# Patient Record
Sex: Male | Born: 1960 | Race: Black or African American | Hispanic: No | Marital: Married | Smoking: Never smoker
Health system: Southern US, Community
[De-identification: ages and names within clinical notes are randomized; demographics above are authoritative.]

## PROBLEM LIST (undated history)

## (undated) DIAGNOSIS — G4730 Sleep apnea, unspecified: Secondary | ICD-10-CM

## (undated) DIAGNOSIS — N189 Chronic kidney disease, unspecified: Secondary | ICD-10-CM

## (undated) DIAGNOSIS — I2721 Secondary pulmonary arterial hypertension: Secondary | ICD-10-CM

## (undated) DIAGNOSIS — I509 Heart failure, unspecified: Secondary | ICD-10-CM

## (undated) DIAGNOSIS — D649 Anemia, unspecified: Secondary | ICD-10-CM

## (undated) DIAGNOSIS — I2510 Atherosclerotic heart disease of native coronary artery without angina pectoris: Secondary | ICD-10-CM

## (undated) DIAGNOSIS — I4892 Unspecified atrial flutter: Secondary | ICD-10-CM

## (undated) DIAGNOSIS — I420 Dilated cardiomyopathy: Secondary | ICD-10-CM

## (undated) DIAGNOSIS — R519 Headache, unspecified: Secondary | ICD-10-CM

## (undated) DIAGNOSIS — F4310 Post-traumatic stress disorder, unspecified: Secondary | ICD-10-CM

## (undated) DIAGNOSIS — N186 End stage renal disease: Secondary | ICD-10-CM

## (undated) DIAGNOSIS — I10 Essential (primary) hypertension: Secondary | ICD-10-CM

## (undated) DIAGNOSIS — G4733 Obstructive sleep apnea (adult) (pediatric): Secondary | ICD-10-CM

## (undated) DIAGNOSIS — R001 Bradycardia, unspecified: Secondary | ICD-10-CM

## (undated) DIAGNOSIS — N529 Male erectile dysfunction, unspecified: Secondary | ICD-10-CM

## (undated) DIAGNOSIS — E785 Hyperlipidemia, unspecified: Secondary | ICD-10-CM

## (undated) DIAGNOSIS — R972 Elevated prostate specific antigen [PSA]: Secondary | ICD-10-CM

## (undated) DIAGNOSIS — Z7901 Long term (current) use of anticoagulants: Secondary | ICD-10-CM

## (undated) HISTORY — DX: Heart failure, unspecified: I50.9

## (undated) HISTORY — DX: Post-traumatic stress disorder, unspecified: F43.10

## (undated) HISTORY — DX: Chronic kidney disease, unspecified: N18.9

---

## 2011-03-03 ENCOUNTER — Emergency Department: Payer: Self-pay | Admitting: Emergency Medicine

## 2018-01-21 ENCOUNTER — Other Ambulatory Visit: Payer: Self-pay

## 2018-01-21 ENCOUNTER — Emergency Department
Admission: EM | Admit: 2018-01-21 | Discharge: 2018-01-21 | Disposition: A | Discharge status: Home / Self Care | Payer: Federal, State, Local not specified - PPO | Attending: Student in an Organized Health Care Education/Training Program | Admitting: Student in an Organized Health Care Education/Training Program

## 2018-01-21 ENCOUNTER — Emergency Department: Payer: Federal, State, Local not specified - PPO

## 2018-01-21 DIAGNOSIS — M79671 Pain in right foot: Secondary | ICD-10-CM | POA: Diagnosis present

## 2018-01-21 DIAGNOSIS — I10 Essential (primary) hypertension: Secondary | ICD-10-CM | POA: Diagnosis not present

## 2018-01-21 DIAGNOSIS — M7661 Achilles tendinitis, right leg: Secondary | ICD-10-CM | POA: Diagnosis not present

## 2018-01-21 DIAGNOSIS — R52 Pain, unspecified: Secondary | ICD-10-CM

## 2018-01-21 HISTORY — DX: Essential (primary) hypertension: I10

## 2018-01-21 MED ORDER — HYDROCODONE-ACETAMINOPHEN 5-325 MG PO TABS: 1.0000 | ORAL_TABLET | ORAL | 0 refills | Status: DC | PRN

## 2018-01-21 MED ORDER — NAPROXEN 500 MG PO TABS: 500.0000 mg | ORAL_TABLET | Freq: Two times a day (BID) | ORAL | 0 refills | Status: DC

## 2018-01-21 NOTE — Discharge Instructions (Addendum)
° °  °  IMPRESSION:  1. Posterior calcaneal enthesophyte with adjacent heterotopic  calcification, suggesting Achilles calcific enthesopathy.  2. No acute fracture or other osseous abnormality.

## 2018-01-21 NOTE — ED Triage Notes (Signed)
Pt to the er for pain to the right heel. Pt states it is worse in the morning and gets better as the day goes on. Pain occurs with pressure. Pt is ambulatory but with a limp.

## 2018-01-21 NOTE — ED Provider Notes (Signed)
[facility] Emergency Department Provider Note    First MD Initiated Contact with Patient 01/21/18 2155     (approximate)  I have reviewed the triage vital signs and the nursing notes.   HISTORY  Chief Complaint Foot Pain    HPI Louis Huynh is a 57 y.o. male left heel pain. Denies any ankle trauma.  No fevers.  States pain is worse in the morning and gets better throughout the day.  Worse with walking.  Hasn't tried anything else for pain.   Past Medical History:  Diagnosis Date  . Hypertension    No family history on file. History reviewed. No pertinent surgical history. There are no active problems to display for this patient.     Prior to Admission medications   Medication Sig Start Date End Date Taking? Authorizing Provider  HYDROcodone-acetaminophen (NORCO) 5-325 MG tablet Take 1 tablet by mouth every 4 (four) hours as needed for moderate pain. 01/21/18   Merlyn Lot, MD  naproxen (NAPROSYN) 500 MG tablet Take 1 tablet (500 mg total) by mouth 2 (two) times daily with a meal. 01/21/18 01/21/19  Merlyn Lot, MD    Allergies Patient has no allergy information on record.    Social History Social History   Tobacco Use  . Smoking status: Never Smoker  . Smokeless tobacco: Never Used  Substance Use Topics  . Alcohol use: Yes    Comment: occasionally  . Drug use: Not Currently    Review of Systems Patient denies headaches, rhinorrhea, blurry vision, numbness, shortness of breath, chest pain, edema, cough, abdominal pain, nausea, vomiting, diarrhea, dysuria, fevers, rashes or hallucinations unless otherwise stated above in HPI. ____________________________________________   PHYSICAL EXAM:  VITAL SIGNS: Vitals:   01/21/18 2143 01/21/18 2248  BP: (!) 198/125 132/72  Pulse: (!) 53 78  Resp: 18 18  Temp: 98.6 F (37 C)   SpO2: 97% 98%    Constitutional: Alert and oriented. Well appearing and in no acute  distress. Eyes: Conjunctivae are normal.  Head: Atraumatic. Nose: No congestion/rhinnorhea. Mouth/Throat: Mucous membranes are moist.   Neck: Painless ROM.  Cardiovascular:   Good peripheral circulation. Respiratory: Normal respiratory effort.  No retractions.  Gastrointestinal: Soft and nontender.  Musculoskeletal: No lower extremity tenderness .  No joint effusions.  Pain at the insertion of the Achilles tendon on the calcaneus.  No associated erythema or warmth.  Full painless passive range of motion.  Negative Hoffmann sign.  No edema or swelling.  Does have a slight limp with walking. Neurologic:  Normal speech and language. No gross focal neurologic deficits are appreciated.  Skin:  Skin is warm, dry and intact. No rash noted. Psychiatric: Mood and affect are normal. Speech and behavior are normal.  ____________________________________________   LABS (all labs ordered are listed, but only abnormal results are displayed)  No results found for this or any previous visit (from the past 24 hour(s)). ____________________________________________ ____________________________________________  ZOXWRUEAV  I personally reviewed all radiographic images ordered to evaluate for the above acute complaints and reviewed radiology reports and findings.  These findings were personally discussed with the patient.  Please see medical record for radiology report.  ____________________________________________   PROCEDURES  Procedure(s) performed:  Procedures    Critical Care performed: no ____________________________________________   INITIAL IMPRESSION / ASSESSMENT AND PLAN / ED COURSE  Pertinent labs & imaging results that were available during my care of the patient were reviewed by me and considered in my medical decision making (  see chart for details).  DDX: Tendinitis, bursitis, fracture  Louis Huynh is a 57 y.o. who presents to the ED with symptoms as described above.   Symptoms most consistent with Achilles tendinitis versus bursitis.  No evidence of fracture.  I discussed results of x-ray imaging with patient and need for follow-up with orthopedics or podiatry.  Discussed conservative management.  Patient will be given pain medication as well as anti-inflammatory.  Have discussed with the patient and available family all diagnostics and treatments performed thus far and all questions were answered to the best of my ability. The patient demonstrates understanding and agreement with plan.       ____________________________________________   FINAL CLINICAL IMPRESSION(S) / ED DIAGNOSES  Final diagnoses:  Pain of right heel  Achilles bursitis of right lower extremity      NEW MEDICATIONS STARTED DURING THIS VISIT:  Discharge Medication List as of 01/21/2018 10:39 PM    START taking these medications   Details  HYDROcodone-acetaminophen (NORCO) 5-325 MG tablet Take 1 tablet by mouth every 4 (four) hours as needed for moderate pain., Starting Wed 01/21/2018, Print    naproxen (NAPROSYN) 500 MG tablet Take 1 tablet (500 mg total) by mouth 2 (two) times daily with a meal., Starting Wed 01/21/2018, Until Thu 01/21/2019, Print         Note:  This document was prepared using Dragon voice recognition software and may include unintentional dictation errors.     Merlyn Lot, MD 01/21/18 [phone]

## 2018-08-18 DIAGNOSIS — N183 Chronic kidney disease, stage 3 unspecified: Secondary | ICD-10-CM | POA: Insufficient documentation

## 2018-08-18 DIAGNOSIS — I129 Hypertensive chronic kidney disease with stage 1 through stage 4 chronic kidney disease, or unspecified chronic kidney disease: Secondary | ICD-10-CM | POA: Insufficient documentation

## 2018-08-18 DIAGNOSIS — I10 Essential (primary) hypertension: Secondary | ICD-10-CM | POA: Insufficient documentation

## 2018-10-21 ENCOUNTER — Encounter: Payer: Self-pay | Admitting: Emergency Medicine

## 2018-10-21 ENCOUNTER — Other Ambulatory Visit: Payer: Self-pay

## 2018-10-21 ENCOUNTER — Emergency Department: Payer: Federal, State, Local not specified - PPO

## 2018-10-21 ENCOUNTER — Inpatient Hospital Stay
Admission: EM | Admit: 2018-10-21 | Discharge: 2018-10-25 | DRG: 291 | Disposition: A | Discharge status: Home / Self Care | Payer: Federal, State, Local not specified - PPO | Attending: Internal Medicine | Admitting: Internal Medicine

## 2018-10-21 DIAGNOSIS — I130 Hypertensive heart and chronic kidney disease with heart failure and stage 1 through stage 4 chronic kidney disease, or unspecified chronic kidney disease: Principal | ICD-10-CM | POA: Diagnosis present

## 2018-10-21 DIAGNOSIS — I509 Heart failure, unspecified: Secondary | ICD-10-CM

## 2018-10-21 DIAGNOSIS — I5041 Acute combined systolic (congestive) and diastolic (congestive) heart failure: Secondary | ICD-10-CM | POA: Diagnosis present

## 2018-10-21 DIAGNOSIS — N179 Acute kidney failure, unspecified: Secondary | ICD-10-CM | POA: Diagnosis present

## 2018-10-21 DIAGNOSIS — R0600 Dyspnea, unspecified: Secondary | ICD-10-CM

## 2018-10-21 DIAGNOSIS — E669 Obesity, unspecified: Secondary | ICD-10-CM | POA: Diagnosis present

## 2018-10-21 DIAGNOSIS — Z6834 Body mass index (BMI) 34.0-34.9, adult: Secondary | ICD-10-CM

## 2018-10-21 DIAGNOSIS — R600 Localized edema: Secondary | ICD-10-CM

## 2018-10-21 DIAGNOSIS — Z8249 Family history of ischemic heart disease and other diseases of the circulatory system: Secondary | ICD-10-CM

## 2018-10-21 DIAGNOSIS — E785 Hyperlipidemia, unspecified: Secondary | ICD-10-CM | POA: Diagnosis present

## 2018-10-21 DIAGNOSIS — R0609 Other forms of dyspnea: Secondary | ICD-10-CM | POA: Diagnosis not present

## 2018-10-21 DIAGNOSIS — Z20828 Contact with and (suspected) exposure to other viral communicable diseases: Secondary | ICD-10-CM | POA: Diagnosis present

## 2018-10-21 DIAGNOSIS — R778 Other specified abnormalities of plasma proteins: Secondary | ICD-10-CM

## 2018-10-21 DIAGNOSIS — N189 Chronic kidney disease, unspecified: Secondary | ICD-10-CM | POA: Diagnosis present

## 2018-10-21 DIAGNOSIS — I5021 Acute systolic (congestive) heart failure: Secondary | ICD-10-CM

## 2018-10-21 DIAGNOSIS — I160 Hypertensive urgency: Secondary | ICD-10-CM | POA: Diagnosis present

## 2018-10-21 DIAGNOSIS — Z79899 Other long term (current) drug therapy: Secondary | ICD-10-CM

## 2018-10-21 LAB — BASIC METABOLIC PANEL
Anion gap: 9 (ref 5–15)
BUN: 44 mg/dL — ABNORMAL HIGH (ref 6–20)
CO2: 23 mmol/L (ref 22–32)
Calcium: 8.9 mg/dL (ref 8.9–10.3)
Chloride: 107 mmol/L (ref 98–111)
Creatinine, Ser: 3.15 mg/dL — ABNORMAL HIGH (ref 0.61–1.24)
GFR calc Af Amer: 24 mL/min — ABNORMAL LOW (ref >60)
GFR calc non Af Amer: 21 mL/min — ABNORMAL LOW (ref >60)
Glucose, Bld: 120 mg/dL — ABNORMAL HIGH (ref 70–99)
Potassium: 4.2 mmol/L (ref 3.5–5.1)
Sodium: 139 mmol/L (ref 135–145)

## 2018-10-21 LAB — TROPONIN I (HIGH SENSITIVITY): Troponin I (High Sensitivity): 213 ng/L (ref <18)

## 2018-10-21 LAB — CBC
HCT: 49.5 % (ref 39.0–52.0)
Hemoglobin: 16.6 g/dL (ref 13.0–17.0)
MCH: 30.9 pg (ref 26.0–34.0)
MCHC: 33.5 g/dL (ref 30.0–36.0)
MCV: 92 fL (ref 80.0–100.0)
Platelets: 231 10*3/uL (ref 150–400)
RBC: 5.38 MIL/uL (ref 4.22–5.81)
RDW: 13.5 % (ref 11.5–15.5)
WBC: 8.5 10*3/uL (ref 4.0–10.5)
nRBC: 0 % (ref 0.0–0.2)

## 2018-10-21 LAB — BRAIN NATRIURETIC PEPTIDE: B Natriuretic Peptide: 822 pg/mL — ABNORMAL HIGH (ref 0.0–100.0)

## 2018-10-21 MED ORDER — FUROSEMIDE 10 MG/ML IJ SOLN
40.0000 mg | Freq: Once | INTRAMUSCULAR | Status: AC
  Administered 2018-10-21: 40 mg via INTRAVENOUS
  Filled 2018-10-21: qty 4

## 2018-10-21 MED ORDER — SODIUM CHLORIDE 0.9% FLUSH: 3.0000 mL | Freq: Once | INTRAVENOUS | Status: DC

## 2018-10-21 NOTE — ED Notes (Signed)
+  2 bilateral LE edema

## 2018-10-21 NOTE — ED Notes (Signed)
Dr. Alfred Levins aware of Troponin of 213.

## 2018-10-21 NOTE — ED Triage Notes (Addendum)
Pt to ER with c/o left leg, foot and ankle swelling x 1 week.  Also states gets [facility] when he bends over.  2+ pitting edema noted to ankle, PMS intact.

## 2018-10-21 NOTE — ED Provider Notes (Signed)
[facility] Emergency Department Provider Note   ____________________________________________   First MD Initiated Contact with Patient 10/21/18 2141     (approximate)  I have reviewed the triage vital signs and the nursing notes.   HISTORY  Chief Complaint Leg Swelling    HPI Louis Huynh is a 58 y.o. male with past medical history of hypertension presenting to the ED complaining of leg swelling and shortness of breath.  Patient reports he has had 1 to 2 weeks of gradually worsening swelling in his bilateral lower extremities.  Along with this, he has had difficulty catching his breath with exertion, states he will get out of breath especially when walking up the stairs.  He denies any recent episodes of chest pain and states his breathing is okay when at rest.  He has not had any recent fevers or chills, but has had a cough and a sensation that he cannot clear the phlegm from his throat.  He denies any significant cardiac history.        Past Medical History:  Diagnosis Date  . Hypertension     There are no active problems to display for this patient.   History reviewed. No pertinent surgical history.  Prior to Admission medications   Medication Sig Start Date End Date Taking? Authorizing Provider  HYDROcodone-acetaminophen (NORCO) 5-325 MG tablet Take 1 tablet by mouth every 4 (four) hours as needed for moderate pain. 01/21/18   Merlyn Lot, MD  naproxen (NAPROSYN) 500 MG tablet Take 1 tablet (500 mg total) by mouth 2 (two) times daily with a meal. 01/21/18 01/21/19  Merlyn Lot, MD    Allergies Patient has no allergy information on record.  History reviewed. No pertinent family history.  Social History Social History   Tobacco Use  . Smoking status: Never Smoker  . Smokeless tobacco: Never Used  Substance Use Topics  . Alcohol use: Yes    Comment: occasionally  . Drug use: Not Currently    Review of Systems   Constitutional: No fever/chills Eyes: No visual changes. ENT: No sore throat. Cardiovascular: Denies chest pain. Respiratory: Positive for shortness of breath. Gastrointestinal: No abdominal pain.  No nausea, no vomiting.  No diarrhea.  No constipation. Genitourinary: Negative for dysuria. Musculoskeletal: Negative for back pain.  Positive for lower extremity swelling. Skin: Negative for rash. Neurological: Negative for headaches, focal weakness or numbness.  ____________________________________________   PHYSICAL EXAM:  VITAL SIGNS: ED Triage Vitals  Enc Vitals Group     BP 10/21/18 1747 (!) 174/135     Pulse Rate 10/21/18 1747 (!) 53     Resp 10/21/18 1747 18     Temp 10/21/18 1747 98.6 F (37 C)     Temp src --      SpO2 10/21/18 1747 98 %     Weight 10/21/18 1749 242 lb (109.8 kg)     Height 10/21/18 1749 5\' 8"  (1.727 m)     Head Circumference --      Peak Flow --      Pain Score 10/21/18 1749 0     Pain Loc --      Pain Edu? --      Excl. in [place]? --     Constitutional: Alert and oriented. Eyes: Conjunctivae are normal. Head: Atraumatic. Nose: No congestion/rhinnorhea. Mouth/Throat: Mucous membranes are moist. Neck: Normal ROM Cardiovascular: Normal rate, regular rhythm. Grossly normal heart sounds. Respiratory: Normal respiratory effort.  No retractions.  Rales to bilateral bases. Gastrointestinal:  Soft and nontender. No distention. Genitourinary: deferred Musculoskeletal: No lower extremity tenderness.  2+ pitting edema to bilateral lower extremities up to bilateral knees. Neurologic:  Normal speech and language. No gross focal neurologic deficits are appreciated. Skin:  Skin is warm, dry and intact. No rash noted. Psychiatric: Mood and affect are normal. Speech and behavior are normal.  ____________________________________________   LABS (all labs ordered are listed, but only abnormal results are displayed)  Labs Reviewed  BASIC METABOLIC PANEL -  Abnormal; Notable for the following components:      Result Value   Glucose, Bld 120 (*)    BUN 44 (*)    Creatinine, Ser 3.15 (*)    GFR calc non Af Amer 21 (*)    GFR calc Af Amer 24 (*)    All other components within normal limits  BRAIN NATRIURETIC PEPTIDE - Abnormal; Notable for the following components:   B Natriuretic Peptide 822.0 (*)    All other components within normal limits  TROPONIN I (HIGH SENSITIVITY) - Abnormal; Notable for the following components:   Troponin I (High Sensitivity) 213 (*)    All other components within normal limits  SARS CORONAVIRUS 2 (HOSPITAL ORDER, [place] [facility])  CBC  TROPONIN I (HIGH SENSITIVITY)   ____________________________________________  EKG  ED ECG REPORT I, Blake Divine, the attending physician, personally viewed and interpreted this ECG.   Date: 10/21/2018  EKG Time: 17:44  Rate: 106  Rhythm: normal sinus rhythm  Axis: Normal  Intervals:none  ST&T Change: Inferior and lateral TWI   PROCEDURES  Procedure(s) performed (including Critical Care):  Procedures   ____________________________________________   INITIAL IMPRESSION / ASSESSMENT AND PLAN / ED COURSE       58 year old male presenting with increasing lower extremity swelling over the past 1 to [redacted] weeks along with dyspnea on exertion.  He appears clinically fluid overloaded given lower extremity edema and his chest x-ray is consistent with this, showing cardiomegaly and pulmonary edema.  Given his elevated BNP, this all appears consistent with new onset CHF.  No apparent cardiac ischemia currently, although given his inferior and lateral T wave inversions it is possible that he has had a cardiac event in the recent past.  Will initiate diuresis and admit for further management.  Troponin significantly elevated at 213 but do not suspect ACS given he has asymptomatic currently and has not had chest pain at any point.  This is more likely  related to his new onset CHF as well as AKI it would hold off on starting heparin drip at this time.  He does remain significantly hypertensive, will Place Nitropaste to assist with blood pressure and CHF.  Case discussed with hospitalist, who accepts patient for admission.      ____________________________________________   FINAL CLINICAL IMPRESSION(S) / ED DIAGNOSES  Final diagnoses:  Acute systolic congestive heart failure (HCC)  Bilateral lower extremity edema  Dyspnea on exertion  Elevated troponin     ED Discharge Orders    None       Note:  This document was prepared using Dragon voice recognition software and may include unintentional dictation errors.   Blake Divine, MD 10/22/18 [phone]

## 2018-10-21 NOTE — ED Notes (Signed)
Lab notified to add on BNP 

## 2018-10-22 ENCOUNTER — Other Ambulatory Visit: Payer: Self-pay

## 2018-10-22 ENCOUNTER — Inpatient Hospital Stay: Payer: Federal, State, Local not specified - PPO

## 2018-10-22 ENCOUNTER — Inpatient Hospital Stay (HOSPITAL_COMMUNITY)
Admit: 2018-10-22 | Discharge: 2018-10-22 | Disposition: A | Discharge status: Home / Self Care | Payer: Federal, State, Local not specified - PPO | Attending: Family Medicine | Admitting: Family Medicine

## 2018-10-22 DIAGNOSIS — I340 Nonrheumatic mitral (valve) insufficiency: Secondary | ICD-10-CM

## 2018-10-22 DIAGNOSIS — R0609 Other forms of dyspnea: Secondary | ICD-10-CM | POA: Diagnosis present

## 2018-10-22 DIAGNOSIS — Z20828 Contact with and (suspected) exposure to other viral communicable diseases: Secondary | ICD-10-CM | POA: Diagnosis present

## 2018-10-22 DIAGNOSIS — Z6834 Body mass index (BMI) 34.0-34.9, adult: Secondary | ICD-10-CM | POA: Diagnosis not present

## 2018-10-22 DIAGNOSIS — Z79899 Other long term (current) drug therapy: Secondary | ICD-10-CM | POA: Diagnosis not present

## 2018-10-22 DIAGNOSIS — I509 Heart failure, unspecified: Secondary | ICD-10-CM

## 2018-10-22 DIAGNOSIS — I361 Nonrheumatic tricuspid (valve) insufficiency: Secondary | ICD-10-CM

## 2018-10-22 DIAGNOSIS — E785 Hyperlipidemia, unspecified: Secondary | ICD-10-CM | POA: Diagnosis present

## 2018-10-22 DIAGNOSIS — N179 Acute kidney failure, unspecified: Secondary | ICD-10-CM | POA: Diagnosis present

## 2018-10-22 DIAGNOSIS — I160 Hypertensive urgency: Secondary | ICD-10-CM | POA: Diagnosis present

## 2018-10-22 DIAGNOSIS — E669 Obesity, unspecified: Secondary | ICD-10-CM | POA: Diagnosis present

## 2018-10-22 DIAGNOSIS — N189 Chronic kidney disease, unspecified: Secondary | ICD-10-CM | POA: Diagnosis present

## 2018-10-22 DIAGNOSIS — I5041 Acute combined systolic (congestive) and diastolic (congestive) heart failure: Secondary | ICD-10-CM | POA: Diagnosis present

## 2018-10-22 DIAGNOSIS — I130 Hypertensive heart and chronic kidney disease with heart failure and stage 1 through stage 4 chronic kidney disease, or unspecified chronic kidney disease: Secondary | ICD-10-CM | POA: Diagnosis present

## 2018-10-22 DIAGNOSIS — Z8249 Family history of ischemic heart disease and other diseases of the circulatory system: Secondary | ICD-10-CM | POA: Diagnosis not present

## 2018-10-22 LAB — CBC WITH DIFFERENTIAL/PLATELET
Abs Immature Granulocytes: 0.04 10*3/uL (ref 0.00–0.07)
Basophils Absolute: 0.1 10*3/uL (ref 0.0–0.1)
Basophils Relative: 1 %
Eosinophils Absolute: 0.1 10*3/uL (ref 0.0–0.5)
Eosinophils Relative: 1 %
HCT: 51.6 % (ref 39.0–52.0)
Hemoglobin: 17.4 g/dL — ABNORMAL HIGH (ref 13.0–17.0)
Immature Granulocytes: 1 %
Lymphocytes Relative: 19 %
Lymphs Abs: 1.5 10*3/uL (ref 0.7–4.0)
MCH: 31.1 pg (ref 26.0–34.0)
MCHC: 33.7 g/dL (ref 30.0–36.0)
MCV: 92.1 fL (ref 80.0–100.0)
Monocytes Absolute: 1.1 10*3/uL — ABNORMAL HIGH (ref 0.1–1.0)
Monocytes Relative: 13 %
Neutro Abs: 5.5 10*3/uL (ref 1.7–7.7)
Neutrophils Relative %: 65 %
Platelets: 221 10*3/uL (ref 150–400)
RBC: 5.6 MIL/uL (ref 4.22–5.81)
RDW: 13.6 % (ref 11.5–15.5)
WBC: 8.3 10*3/uL (ref 4.0–10.5)
nRBC: 0 % (ref 0.0–0.2)

## 2018-10-22 LAB — TROPONIN I (HIGH SENSITIVITY): Troponin I (High Sensitivity): 196 ng/L (ref <18)

## 2018-10-22 LAB — SARS CORONAVIRUS 2 BY RT PCR (HOSPITAL ORDER, PERFORMED IN ~~LOC~~ HOSPITAL LAB): SARS Coronavirus 2: NEGATIVE

## 2018-10-22 LAB — ECHOCARDIOGRAM COMPLETE
Height: 68 in
Weight: 3811.2 oz

## 2018-10-22 MED ORDER — SODIUM CHLORIDE 0.9% FLUSH: 3.0000 mL | INTRAVENOUS | Status: DC | PRN

## 2018-10-22 MED ORDER — NITROGLYCERIN 2 % TD OINT
1.0000 [in_us] | TOPICAL_OINTMENT | Freq: Once | TRANSDERMAL | Status: AC
  Administered 2018-10-22: 1 [in_us] via TOPICAL
  Filled 2018-10-22: qty 1

## 2018-10-22 MED ORDER — AMLODIPINE BESYLATE 10 MG PO TABS
10.0000 mg | ORAL_TABLET | Freq: Every day | ORAL | Status: DC
  Administered 2018-10-22 – 2018-10-25 (×4): 10 mg via ORAL
  Filled 2018-10-22 (×4): qty 1

## 2018-10-22 MED ORDER — FUROSEMIDE 10 MG/ML IJ SOLN
40.0000 mg | Freq: Two times a day (BID) | INTRAMUSCULAR | Status: DC
  Administered 2018-10-22 – 2018-10-25 (×7): 40 mg via INTRAVENOUS
  Filled 2018-10-22 (×7): qty 4

## 2018-10-22 MED ORDER — ACETAMINOPHEN 325 MG PO TABS
650.0000 mg | ORAL_TABLET | ORAL | Status: DC | PRN
  Administered 2018-10-22 – 2018-10-24 (×2): 650 mg via ORAL
  Filled 2018-10-22 (×2): qty 2

## 2018-10-22 MED ORDER — ONDANSETRON HCL 4 MG/2ML IJ SOLN: 4.0000 mg | Freq: Four times a day (QID) | INTRAMUSCULAR | Status: DC | PRN

## 2018-10-22 MED ORDER — ENOXAPARIN SODIUM 40 MG/0.4ML ~~LOC~~ SOLN: 40.0000 mg | SUBCUTANEOUS | Status: DC

## 2018-10-22 MED ORDER — SODIUM CHLORIDE 0.9 % IV SOLN: 250.0000 mL | INTRAVENOUS | Status: DC | PRN

## 2018-10-22 MED ORDER — HYDRALAZINE HCL 20 MG/ML IJ SOLN
10.0000 mg | Freq: Four times a day (QID) | INTRAMUSCULAR | Status: DC | PRN
  Administered 2018-10-22 – 2018-10-25 (×3): 10 mg via INTRAVENOUS
  Filled 2018-10-22 (×3): qty 1

## 2018-10-22 MED ORDER — SODIUM CHLORIDE 0.9% FLUSH
3.0000 mL | Freq: Two times a day (BID) | INTRAVENOUS | Status: DC
  Administered 2018-10-22 – 2018-10-25 (×8): 3 mL via INTRAVENOUS

## 2018-10-22 MED ORDER — ISOSORBIDE MONONITRATE ER 30 MG PO TB24
30.0000 mg | ORAL_TABLET | Freq: Every day | ORAL | Status: DC
  Administered 2018-10-22 – 2018-10-25 (×4): 30 mg via ORAL
  Filled 2018-10-22 (×4): qty 1

## 2018-10-22 MED ORDER — AMITRIPTYLINE HCL 25 MG PO TABS
50.0000 mg | ORAL_TABLET | Freq: Every day | ORAL | Status: DC
  Administered 2018-10-22 – 2018-10-24 (×3): 50 mg via ORAL
  Filled 2018-10-22 (×3): qty 2

## 2018-10-22 MED ORDER — HEPARIN SODIUM (PORCINE) 5000 UNIT/ML IJ SOLN
5000.0000 [IU] | Freq: Three times a day (TID) | INTRAMUSCULAR | Status: DC
  Administered 2018-10-22 – 2018-10-25 (×10): 5000 [IU] via SUBCUTANEOUS
  Filled 2018-10-22 (×10): qty 1

## 2018-10-22 MED ORDER — ZOLPIDEM TARTRATE 5 MG PO TABS: 5.0000 mg | ORAL_TABLET | Freq: Every evening | ORAL | Status: DC | PRN

## 2018-10-22 MED ORDER — CARVEDILOL 6.25 MG PO TABS
6.2500 mg | ORAL_TABLET | Freq: Two times a day (BID) | ORAL | Status: DC
  Administered 2018-10-22 – 2018-10-23 (×3): 6.25 mg via ORAL
  Filled 2018-10-22 (×3): qty 1

## 2018-10-22 MED ORDER — NITROGLYCERIN 2 % TD OINT
1.0000 [in_us] | TOPICAL_OINTMENT | Freq: Four times a day (QID) | TRANSDERMAL | Status: DC
  Administered 2018-10-22: 1 [in_us] via TOPICAL
  Filled 2018-10-22 (×3): qty 1

## 2018-10-22 MED ORDER — HYDRALAZINE HCL 25 MG PO TABS
25.0000 mg | ORAL_TABLET | Freq: Three times a day (TID) | ORAL | Status: DC
  Administered 2018-10-22 – 2018-10-24 (×6): 25 mg via ORAL
  Filled 2018-10-22 (×6): qty 1

## 2018-10-22 MED ORDER — ASPIRIN EC 81 MG PO TBEC
81.0000 mg | DELAYED_RELEASE_TABLET | Freq: Every day | ORAL | Status: DC
  Administered 2018-10-22 – 2018-10-25 (×4): 81 mg via ORAL
  Filled 2018-10-22 (×4): qty 1

## 2018-10-22 NOTE — Consult Note (Addendum)
[facility]  CARDIOLOGY CONSULT NOTE  Patient ID: Louis Huynh MRN: [id number] DOB/AGE: 18-May-1960 58 y.o.  Admit date: 10/21/2018 Referring Physician Dr. Bridgett Larsson Primary Physician Lynn Primary Cardiologist None Reason for Consultation New onset CHF  HPI:  Louis Huynh is a 58 y.o. with a past medical history of HTN, HLD, and CKD, followed by the [place], who presented to the ED yesterday given one week of progressive lower extremity edema and dyspnea with exertion. The patient became ill with a URI-like illness last week with dry cough and nasal congestion. Shortly afterwards, he noticed new dyspnea with exertion. In his usual state of health, he is typically able to walk between 1/2 - 1 mile at a time without any difficulty. However, last week he noticed becoming short of breath when walking from the parking lot to his work, which has never happened before. He has also had lower extremity edema, worse in the left leg beginning one week ago. He denies any history of same. He has no know cardiac history.   ED work up showed elevated BNP to 822. Creatinine elevated to 3.15 - unknown baseline as [facility]. Troponin was elevated initially to 213, then 196 on repeat. CXR suggestive of pulmonary edema.   Review of systems complete and found to be negative unless listed above   Past Medical History:  Diagnosis Date  . Hypertension     History reviewed. No pertinent surgical history.  Medications Prior to Admission  Medication Sig Dispense Refill Last Dose  . HYDROcodone-acetaminophen (NORCO) 5-325 MG tablet Take 1 tablet by mouth every 4 (four) hours as needed for moderate pain. 6 tablet 0   . naproxen (NAPROSYN) 500 MG tablet Take 1 tablet (500 mg total) by mouth 2 (two) times daily with a meal. 20 tablet 0    Social History   Socioeconomic History  . Marital status: Married    Spouse name: Not on file  . Number of children: Not on file  . Years of  education: Not on file  . Highest education level: Not on file  Occupational History  . Not on file  Social Needs  . Financial resource strain: Not on file  . Food insecurity    Worry: Not on file    Inability: Not on file  . Transportation needs    Medical: Not on file    Non-medical: Not on file  Tobacco Use  . Smoking status: Never Smoker  . Smokeless tobacco: Never Used  Substance and Sexual Activity  . Alcohol use: Yes    Comment: occasionally  . Drug use: Not Currently  . Sexual activity: Yes  Lifestyle  . Physical activity    Days per week: Not on file    Minutes per session: Not on file  . Stress: Not on file  Relationships  . Social Herbalist on phone: Not on file    Gets together: Not on file    Attends religious service: Not on file    Active member of club or organization: Not on file    Attends meetings of clubs or organizations: Not on file    Relationship status: Not on file  . Intimate partner violence    Fear of current or ex partner: Not on file    Emotionally abused: Not on file    Physically abused: Not on file    Forced sexual activity: Not on file  Other Topics Concern  .  Not on file  Social History Narrative  . Not on file    History reviewed. No pertinent family history.   Review of systems complete and found to be negative unless listed above    PHYSICAL EXAM  General: Well developed, well nourished, in no acute distress HEENT:  Normocephalic and atramatic Neck:  No JVD.  Lungs: Clear bilaterally to auscultation and percussion. Faint crackles to bases bilaterally.  Heart: Regular rhythm, tachycardic rate. S3 sound heard. No appreciable murmur. Extremities: 2+ pitting edema to bilateral lower extremities, around ankles, L>R Neuro: Alert and oriented X 3. Psych:  Good affect, responds appropriately  Labs:   Lab Results  Component Value Date   WBC 8.3 10/22/2018   HGB 17.4 (H) 10/22/2018   HCT 51.6 10/22/2018   MCV  92.1 10/22/2018   PLT 221 10/22/2018    Recent Labs  Lab 10/21/18 1752  NA 139  K 4.2  CL 107  CO2 23  BUN 44*  CREATININE 3.15*  CALCIUM 8.9  GLUCOSE 120*   No results found for: CKTOTAL, CKMB, CKMBINDEX, TROPONINI No results found for: CHOL No results found for: HDL No results found for: LDLCALC No results found for: TRIG No results found for: CHOLHDL No results found for: LDLDIRECT    Radiology: Dg Chest 2 View  Result Date: 10/21/2018 CLINICAL DATA:  Shortness of breath, lower extremity swelling for 1 week EXAM: CHEST - 2 VIEW COMPARISON:  None. FINDINGS: Hazy interstitial opacities with cephalized indistinct pulmonary vascularity and cardiomegaly. No consolidation. No pneumothorax or effusion. Remaining cardiomediastinal contours are unremarkable. No acute osseous or soft tissue abnormality. IMPRESSION: Findings compatible with CHF/volume overload including cardiomegaly and pulmonary edema Electronically Signed   By: Lovena Le M.D.   On: 10/21/2018 18:11    EKG:  Sinus rhythm, rate of 106 BPM, normal axis, left atrial enlargement, T-wave V5-V6, II, III, aVF.   ASSESSMENT AND PLAN:  Louis Huynh is a 58 year old male with a history of HTN, HLD, and CKD, seen at the [place] for his care, who presented to the ED early this morning with lower extremity swelling and dyspnea with exertion. CXR with pulmonary edema. BNP elevated to 822. Findings suggestive of new onset heart failure from unknown etiology, although uncontrolled hypertension may be contributing. His troponin was initially elevated to 213, then 196 on repeat. Elevated troponin thought to be secondary to demand ischemia versus hypoxia. Lower suspicion for ACS at this time given no chest pain. Will continue to closely monitor and consider ischemic work up in the outpatient setting.   1. Follow up ECHO results still pending 2. Continue with BP management to goal BP <130/80 3. Elevated troponin likely secondary to demand  ischemia versus hypoxia - no further cardiac interventions or diagnostics indicated at this time 4. Switch lasix to oral when able - continue at 40 mg daily in the outpatient setting  5. Close outpatient follow up with either me or Dr. Nehemiah Massed in 1-2 week following discharge   The patient's history and exam findings were discussed with Dr. Nehemiah Massed. The plan was made in conjunction with Dr. Nehemiah Massed.  Signed: Hilbert Odor PA-C 10/22/2018, 9:51 AM  The patient has been interviewed and examined. I agree with assessment and plan above. Serafina Royals MD [facility]

## 2018-10-22 NOTE — Progress Notes (Signed)
Ch called pt and spoke with him regarding [place]. Pt shared that he would like all life-prolonging measures at this time. Pt expressed his concerns about his recent diagnosis of CHF. Ch allowed space for pt to lament about his health changes. Pt shared that his brothers both had CHF as well. Ch provided words of encouragement and informed pt that she would provide an AD to him within the hour.   Ch visited pt to complete AD education. Pt was alert and oriented upon ch arrival. 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 Ch offered prayer for healing for pt with wife present. Visit was appreciated.    10/22/18 1200  Clinical Encounter Type  Visited With Patient and family together  Visit Type Psychological support;Spiritual support;Social support;Other (Comment) (AD education )  Referral From Physician  Consult/Referral To Chaplain  Spiritual Encounters  Spiritual Needs Emotional;Grief support;Prayer  Stress Factors  Patient Stress Factors Health changes;Major life changes  Family Stress Factors None identified  Advance Directives (For Healthcare)  Does Patient Have a Medical Advance Directive? No  Would patient like information on creating a medical advance directive? Yes (Inpatient - patient defers creating a medical advance directive at this time - Information given)

## 2018-10-22 NOTE — Progress Notes (Signed)
To ultrasound via bed 

## 2018-10-22 NOTE — Progress Notes (Signed)
*  PRELIMINARY RESULTS* Echocardiogram 2D Echocardiogram has been performed.  Sherrie Sport 10/22/2018, 10:52 AM

## 2018-10-22 NOTE — ED Notes (Signed)
ED TO INPATIENT HANDOFF REPORT  ED Nurse Name and Phone #: Tana Conch Halma Name/Age/Gender Louis Huynh 58 y.o. male Room/Bed: ED25A/ED25A  Code Status   Code Status: Full Code  Home/SNF/Other Home Patient oriented to: self, place, time and situation Is this baseline? Yes   Triage Complete: Triage complete  Chief Complaint leg swelling, SOB  Triage Note Pt to ER with c/o left leg, foot and ankle swelling x 1 week.  Also states gets [facility] when he bends over.  2+ pitting edema noted to ankle, PMS intact.   Allergies Not on File  Level of Care/Admitting Diagnosis ED Disposition    ED Disposition Condition [facility] Area: [place] [[id number]]  Level of Care: Telemetry [5]  Covid Evaluation: Asymptomatic Screening Protocol (No Symptoms)  Diagnosis: Acute CHF (congestive heart failure) [facility]) [id number]  Admitting Physician: Christel Mormon [id number]  Attending Physician: Christel Mormon [id number]  Estimated length of stay: past midnight tomorrow  Certification:: I certify this patient will need inpatient services for at least 2 midnights  PT Class (Do Not Modify): Inpatient [101]  PT Acc Code (Do Not Modify): Private [1]       B Medical/Surgery History Past Medical History:  Diagnosis Date  . Hypertension    History reviewed. No pertinent surgical history.   A IV Location/Drains/Wounds Patient Lines/Drains/Airways Status   Active Line/Drains/Airways    Name:   Placement date:   Placement time:   Site:   Days:   Peripheral IV 10/21/18 Right Forearm   10/21/18    2211    Forearm   1          Intake/Output Last 24 hours  Intake/Output Summary (Last 24 hours) at 10/22/2018 0301 Last data filed at 10/22/2018 0300 Gross per 24 hour  Intake -  Output 1550 ml  Net -1550 ml    Labs/Imaging Results for orders placed or performed during the hospital encounter of 10/21/18 (from the past 48 hour(s))  Basic metabolic panel      Status: Abnormal   Collection Time: 10/21/18  5:52 PM  Result Value Ref Range   Sodium 139 135 - 145 mmol/L   Potassium 4.2 3.5 - 5.1 mmol/L   Chloride 107 98 - 111 mmol/L   CO2 23 22 - 32 mmol/L   Glucose, Bld 120 (H) 70 - 99 mg/dL   BUN 44 (H) 6 - 20 mg/dL   Creatinine, Ser 3.15 (H) 0.61 - 1.24 mg/dL   Calcium 8.9 8.9 - 10.3 mg/dL   GFR calc non Af Amer 21 (L) >60 mL/min   GFR calc Af Amer 24 (L) >60 mL/min   Anion gap 9 5 - 15    Comment: Performed at [facility], [address] [place], [place] [address]  CBC     Status: None   Collection Time: 10/21/18  5:52 PM  Result Value Ref Range   WBC 8.5 4.0 - 10.5 K/uL   RBC 5.38 4.22 - 5.81 MIL/uL   Hemoglobin 16.6 13.0 - 17.0 g/dL   HCT 49.5 39.0 - 52.0 %   MCV 92.0 80.0 - 100.0 fL   MCH 30.9 26.0 - 34.0 pg   MCHC 33.5 30.0 - 36.0 g/dL   RDW 13.5 11.5 - 15.5 %   Platelets 231 150 - 400 K/uL   nRBC 0.0 0.0 - 0.2 %    Comment: Performed at [facility], [address] [place],  [place] [address]  Brain natriuretic peptide     Status: Abnormal   Collection Time: 10/21/18  5:52 PM  Result Value Ref Range   B Natriuretic Peptide 822.0 (H) 0.0 - 100.0 pg/mL    Comment: Performed at [facility], [place], [place] [address]  Troponin I (High Sensitivity)     Status: Abnormal   Collection Time: 10/21/18 11:03 PM  Result Value Ref Range   Troponin I (High Sensitivity) 213 (HH) <18 ng/L    Comment: CRITICAL RESULT CALLED TO, READ BACK BY AND VERIFIED WITH SONJA Loyde Orth ON 10/21/18 AT 2354 [facility] (NOTE) Elevated high sensitivity troponin I (hsTnI) values and significant  changes across serial measurements may suggest ACS but many other  chronic and acute conditions are known to elevate hsTnI results.  Refer to the "Links" section for chest pain algorithms and additional  guidance. Performed at [facility], [address] [place], [place] [address]   SARS Coronavirus 2  [facility] order, Performed in [facility] hospital lab) Nasopharyngeal Nasopharyngeal Swab     Status: None   Collection Time: 10/22/18 12:48 AM   Specimen: Nasopharyngeal Swab  Result Value Ref Range   SARS Coronavirus 2 NEGATIVE NEGATIVE    Comment: (NOTE) If result is NEGATIVE SARS-CoV-2 target nucleic acids are NOT DETECTED. The SARS-CoV-2 RNA is generally detectable in upper and lower  respiratory specimens during the acute phase of infection. The lowest  concentration of SARS-CoV-2 viral copies this assay can detect is 250  copies / mL. A negative result does not preclude SARS-CoV-2 infection  and should not be used as the sole basis for treatment or other  patient management decisions.  A negative result may occur with  improper specimen collection / handling, submission of specimen other  than nasopharyngeal swab, presence of viral mutation(s) within the  areas targeted by this assay, and inadequate number of viral copies  (<250 copies / mL). A negative result must be combined with clinical  observations, patient history, and epidemiological information. If result is POSITIVE SARS-CoV-2 target nucleic acids are DETECTED. The SARS-CoV-2 RNA is generally detectable in upper and lower  respiratory specimens dur ing the acute phase of infection.  Positive  results are indicative of active infection with SARS-CoV-2.  Clinical  correlation with patient history and other diagnostic information is  necessary to determine patient infection status.  Positive results do  not rule out bacterial infection or co-infection with other viruses. If result is PRESUMPTIVE POSTIVE SARS-CoV-2 nucleic acids MAY BE PRESENT.   A presumptive positive result was obtained on the submitted specimen  and confirmed on repeat testing.  While 2019 novel coronavirus  (SARS-CoV-2) nucleic acids may be present in the submitted sample  additional confirmatory testing may be necessary for epidemiological  and /  or clinical management purposes  to differentiate between  SARS-CoV-2 and other Sarbecovirus currently known to infect humans.  If clinically indicated additional testing with an alternate test  methodology [phone]) is advised. The SARS-CoV-2 RNA is generally  detectable in upper and lower respiratory sp ecimens during the acute  phase of infection. The expected result is Negative. Fact Sheet for Patients:  StrictlyIdeas.no Fact Sheet for Healthcare Providers: BankingDealers.co.za This test is not yet approved or cleared by the [place] FDA and has been authorized for detection and/or diagnosis of SARS-CoV-2 by FDA under an Emergency Use Authorization (EUA).  This EUA will remain in effect (meaning this test can be used) for the duration  of the COVID-19 declaration under Section 564(b)(1) of the Act, 21 U.S.C. section 360bbb-3(b)(1), unless the authorization is terminated or revoked sooner. Performed at [facility], [address] [place], [place] [address]    Dg Chest 2 View  Result Date: 10/21/2018 CLINICAL DATA:  Shortness of breath, lower extremity swelling for 1 week EXAM: CHEST - 2 VIEW COMPARISON:  None. FINDINGS: Hazy interstitial opacities with cephalized indistinct pulmonary vascularity and cardiomegaly. No consolidation. No pneumothorax or effusion. Remaining cardiomediastinal contours are unremarkable. No acute osseous or soft tissue abnormality. IMPRESSION: Findings compatible with CHF/volume overload including cardiomegaly and pulmonary edema Electronically Signed   By: Lovena Le M.D.   On: 10/21/2018 18:11    Pending Labs Unresulted Labs (From admission, onward)    Start     Ordered   10/23/18 [id number]  Basic metabolic panel  Daily,   STAT     10/22/18 0057   10/22/18 0500  CBC WITH DIFFERENTIAL  Tomorrow morning,   STAT     10/22/18 0057   10/22/18 0058  HIV antibody (Routine Testing)  Once,   STAT      10/22/18 0057          Vitals/Pain Today's Vitals   10/22/18 0100 10/22/18 0130 10/22/18 0200 10/22/18 0230  BP: (!) 166/123 (!) 175/127 (!) 158/130 (!) 159/114  Pulse: (!) 51 97 (!) 102 100  Resp:      Temp:      SpO2: 93% 91% (!) 89% 90%  Weight:      Height:      PainSc:    0-No pain    Isolation Precautions No active isolations  Medications Medications  sodium chloride flush (NS) 0.9 % injection 3 mL (3 mLs Intravenous Not Given 10/21/18 2144)  sodium chloride flush (NS) 0.9 % injection 3 mL (has no administration in time range)  sodium chloride flush (NS) 0.9 % injection 3 mL (has no administration in time range)  0.9 %  sodium chloride infusion (has no administration in time range)  acetaminophen (TYLENOL) tablet 650 mg (has no administration in time range)  ondansetron (ZOFRAN) injection 4 mg (has no administration in time range)  furosemide (LASIX) injection 40 mg (has no administration in time range)  aspirin EC tablet 81 mg (has no administration in time range)  zolpidem (AMBIEN) tablet 5 mg (has no administration in time range)  hydrALAZINE (APRESOLINE) injection 10 mg (has no administration in time range)  nitroGLYCERIN (NITROGLYN) 2 % ointment 1 inch (1 inch Topical Not Given 10/22/18 0255)  heparin injection 5,000 Units (has no administration in time range)  furosemide (LASIX) injection 40 mg (40 mg Intravenous Given 10/21/18 2309)  nitroGLYCERIN (NITROGLYN) 2 % ointment 1 inch (1 inch Topical Given 10/22/18 0027)    Mobility walks Low fall risk   Focused Assessments Cardiac Assessment Handoff:    No results found for: CKTOTAL, CKMB, CKMBINDEX, TROPONINI No results found for: DDIMER Does the Patient currently have chest pain? No      R Recommendations: See Admitting Provider Note  Report given to:   Additional Notes:

## 2018-10-22 NOTE — Progress Notes (Signed)
The patient feels better shortness of breath.  Still has some leg edema. Vital sign and lab reviewed.  Physical examination done. Continue current treatment. Discussed with the patient and RN.  Time spent about 20 minutes.

## 2018-10-22 NOTE — H&P (Addendum)
Louis Huynh at [place] NAME: Louis Huynh    MR#:  [id number]  DATE OF BIRTH:  1960-04-04  DATE OF ADMISSION:  10/21/2018  PRIMARY CARE PHYSICIAN: System, Pcp Not In   REQUESTING/REFERRING PHYSICIAN: Blake Divine, MD CHIEF COMPLAINT:   Chief Complaint  Patient presents with  . Leg Swelling    HISTORY OF PRESENT ILLNESS:  Louis Huynh  is a 58 y.o. African-American male with a known history of hypertension, presented to the emergency room with acute onset of dyspnea for the last week with associated worsening lower extremity edema and orthopnea as well as dyspnea on exertion and paroxysmal nocturnal dyspnea.  He admitted to dry cough with occasional expectoration of clear sputum as well as occasional wheezing.  He denied any fever or chills.  No recent sick exposures.  No dysuria, oliguria or hematuria or flank pain.  Upon presentation to the emergency room, blood pressure was 174/135 with a pulse of 53 and otherwise normal vital signs.  Labs were remarkable for hemoconcentration and elevated BUN and creatinine 44/3.15 with no previous levels for comparison.  Troponin I was 213 and later 196.  COVID-19 test came back negative.  Chest x-ray showed cardiomegaly and interstitial pulmonary infiltrates concerning for pulmonary edema and acute CHF.  EKG showed sinus tachycardia with a rate of 106 with T wave inversion laterally.  The patient was given Lasix 40 mg IV as well as an inch Nitropaste.  He will be admitted to telemetry bed for further evaluation and management PAST MEDICAL HISTORY:   Past Medical History:  Diagnosis Date  . Hypertension     PAST SURGICAL HISTORY:  History reviewed. No pertinent surgical history.  He denies any previous surgeries.  SOCIAL HISTORY:   Social History   Tobacco Use  . Smoking status: Never Smoker  . Smokeless tobacco: Never Used  Substance Use Topics  . Alcohol use: Yes    Comment: occasionally     FAMILY HISTORY:  History reviewed. No pertinent family history.  Positive for coronary artery disease.  He has 2 brothers who had cardiac transplants. DRUG ALLERGIES:  Not on File.  No reported allergies.  REVIEW OF SYSTEMS:   ROS As per history of present illness. All pertinent systems were reviewed above. Constitutional,  HEENT, cardiovascular, respiratory, GI, GU, musculoskeletal, neuro, psychiatric, endocrine,  integumentary and hematologic systems were reviewed and are otherwise  negative/unremarkable except for positive findings mentioned above in the HPI.   MEDICATIONS AT HOME:   Prior to Admission medications   Medication Sig Start Date End Date Taking? Authorizing Provider  HYDROcodone-acetaminophen (NORCO) 5-325 MG tablet Take 1 tablet by mouth every 4 (four) hours as needed for moderate pain. 01/21/18   Merlyn Lot, MD  naproxen (NAPROSYN) 500 MG tablet Take 1 tablet (500 mg total) by mouth 2 (two) times daily with a meal. 01/21/18 01/21/19  Merlyn Lot, MD      VITAL SIGNS:  Blood pressure (!) 162/120, pulse (!) 50, temperature 98.1 F (36.7 C), temperature source Oral, resp. rate 20, height 5\' 8"  (1.727 m), weight 108 kg, SpO2 95 %.  PHYSICAL EXAMINATION:  Physical Exam  GENERAL:  58 y.o.-year-old African-American male patient lying in the bed with no acute distress.  EYES: Pupils equal, round, reactive to light and accommodation. No scleral icterus. Extraocular muscles intact.  HEENT: Head atraumatic, normocephalic. Oropharynx and nasopharynx clear.  NECK:  Supple, no jugular venous distention. No thyroid enlargement, no tenderness.  LUNGS: Diminished bibasilar breath sounds with bibasal rales CARDIOVASCULAR: Regular rate and rhythm, S1, S2 normal. No murmurs, rubs, or gallops.  ABDOMEN: Soft, nondistended, nontender. Bowel sounds present. No organomegaly or mass.  EXTREMITIES: 2+ bilateral lower extremity pitting l edema, with no cyanosis, or  clubbing.  NEUROLOGIC: Cranial nerves II through XII are intact. Muscle strength 5/5 in all extremities. Sensation intact. Gait not checked.  PSYCHIATRIC: The patient is alert and oriented x 3.  Normal affect and good eye contact. SKIN: No obvious rash, lesion, or ulcer.   LABORATORY PANEL:   CBC Recent Labs  Lab 10/22/18 0410  WBC 8.3  HGB 17.4*  HCT 51.6  PLT 221   ------------------------------------------------------------------------------------------------------------------  Chemistries  Recent Labs  Lab 10/21/18 1752  NA 139  K 4.2  CL 107  CO2 23  GLUCOSE 120*  BUN 44*  CREATININE 3.15*  CALCIUM 8.9   ------------------------------------------------------------------------------------------------------------------  Cardiac Enzymes No results for input(s): TROPONINI in the last 168 hours. ------------------------------------------------------------------------------------------------------------------  RADIOLOGY:  Dg Chest 2 View  Result Date: 10/21/2018 CLINICAL DATA:  Shortness of breath, lower extremity swelling for 1 week EXAM: CHEST - 2 VIEW COMPARISON:  None. FINDINGS: Hazy interstitial opacities with cephalized indistinct pulmonary vascularity and cardiomegaly. No consolidation. No pneumothorax or effusion. Remaining cardiomediastinal contours are unremarkable. No acute osseous or soft tissue abnormality. IMPRESSION: Findings compatible with CHF/volume overload including cardiomegaly and pulmonary edema Electronically Signed   By: Lovena Le M.D.   On: 10/21/2018 18:11      IMPRESSION AND PLAN:   1.  New onset acute CHF, likely diastolic.  The patient will be admitted to to a telemetry bed.  He will be diuresed with IV Lasix.  Will follow serial troponin and obtain a 2D echo and a cardiology consultation this a.m. by [facility].  I notified Dr. Nehemiah Massed.  2.  Hypertensive urgency.  This is likely the culprit for #1.  The patient will be placed on  PRN IV hydralazine.  We will continue him on Nitropaste.  3.  Acute kidney injury.  This could be prerenal from acute CHF.  Will follow BMP with diuresis.  Will obtain bilateral renal ultrasound.  4.  DVT prophylaxis.  Subcutaneous heparin.   All the records are reviewed and case discussed with ED provider. The plan of care was discussed in details with the patient (and family). I answered all questions. The patient agreed to proceed with the above mentioned plan. Further management will depend upon hospital course.   CODE STATUS: Full code  TOTAL TIME TAKING CARE OF THIS PATIENT: 50 minutes.    Christel Mormon M.D on 10/22/2018 at 5:04 AM  Pager - [phone]  After 6pm go to [email or web address] - Proofreader  [facility]  Office  [phone]  CC: Primary care physician; System, Pcp Not In   Note: This dictation was prepared with Dragon dictation along with smaller phrase technology. Any transcriptional errors that result from this process are unintentional.

## 2018-10-22 NOTE — Plan of Care (Signed)
  Problem: Education: Goal: Knowledge of General Education information will improve Description: Including pain rating scale, medication(s)/side effects and non-pharmacologic comfort measures Outcome: Progressing   Problem: Health Behavior/Discharge Planning: Goal: Ability to manage health-related needs will improve Outcome: Progressing   Problem: Clinical Measurements: Goal: Ability to maintain clinical measurements within normal limits will improve Outcome: Progressing Goal: Will remain free from infection Outcome: Progressing Note: Remains afebrile Goal: Diagnostic test results will improve Outcome: Progressing Note: BNP 822, troponin 213, 196 Goal: Cardiovascular complication will be avoided Outcome: Progressing Note: No chest pain at this time   Problem: Education: Goal: Individualized Educational Video(s) Outcome: Progressing Note: Pt watched all 4 HF videos, questions answered

## 2018-10-22 NOTE — Plan of Care (Signed)
  Problem: Education: Goal: Knowledge of General Education information will improve Description: Including pain rating scale, medication(s)/side effects and non-pharmacologic comfort measures Outcome: Progressing Note: Patient admitted at 0400. No complaints of pain. Patient profile completed. Blood pressure elevated, PRN hydralazine administered. Heart failure education initiated.   Problem: Clinical Measurements: Goal: Respiratory complications will improve Outcome: Progressing   Problem: Education: Goal: Ability to demonstrate management of disease process will improve Outcome: Progressing Note: Provide education when patient's wife is available.

## 2018-10-22 NOTE — ED Notes (Signed)
AC called for admission bed.

## 2018-10-22 NOTE — ED Notes (Signed)
Patient placed on 2L O2 via [place] for sats at 89-90%.

## 2018-10-22 NOTE — ED Notes (Signed)
Patient's wife at bedside.

## 2018-10-22 NOTE — Plan of Care (Signed)
  Problem: Education: Goal: Knowledge of General Education information will improve Description: Including pain rating scale, medication(s)/side effects and non-pharmacologic comfort measures Outcome: Progressing   Problem: Health Behavior/Discharge Planning: Goal: Ability to manage health-related needs will improve Outcome: Progressing   Problem: Clinical Measurements: Goal: Ability to maintain clinical measurements within normal limits will improve Outcome: Progressing Goal: Will remain free from infection Outcome: Progressing Note: Remains afebrile Goal: Diagnostic test results will improve Outcome: Progressing Note: BNP 822, troponin 213, 196 Goal: Cardiovascular complication will be avoided Outcome: Progressing Note: No chest pain at this time

## 2018-10-22 NOTE — Plan of Care (Signed)
Nutrition Education Note  RD consulted for nutrition education regarding new onset CHF.  58 y.o. African-American male with a known history of hypertension admitted with new CHF  RD provided "Low Sodium Nutrition Therapy" handout from the Academy of [employer] and Dietetics. Reviewed patient's dietary recall. Provided examples on ways to decrease sodium intake in diet. Discouraged intake of processed foods and use of salt shaker. Encouraged fresh fruits and vegetables as well as whole grain sources of carbohydrates to maximize fiber intake.   RD discussed why it is important for patient to adhere to diet recommendations, and emphasized the role of fluids, foods to avoid, and importance of weighing self daily. Teach back method used.  Expect good compliance.  Body mass index is 36.22 kg/m. Pt meets criteria for obesity based on current BMI.  Current diet order is 2 gram sodium diet, patient is consuming approximately 50-100% of meals at this time. Labs and medications reviewed. No further nutrition interventions warranted at this time. RD contact information provided. If additional nutrition issues arise, please re-consult RD.   Koleen Distance MS, RD, LDN Pager #- [phone] Office#- [phone] After Hours Pager: [phone]

## 2018-10-22 NOTE — Progress Notes (Signed)
Back from ultrasound

## 2018-10-23 LAB — BASIC METABOLIC PANEL
Anion gap: 9 (ref 5–15)
BUN: 39 mg/dL — ABNORMAL HIGH (ref 6–20)
CO2: 26 mmol/L (ref 22–32)
Calcium: 8.5 mg/dL — ABNORMAL LOW (ref 8.9–10.3)
Chloride: 104 mmol/L (ref 98–111)
Creatinine, Ser: 2.78 mg/dL — ABNORMAL HIGH (ref 0.61–1.24)
GFR calc Af Amer: 28 mL/min — ABNORMAL LOW (ref >60)
GFR calc non Af Amer: 24 mL/min — ABNORMAL LOW (ref >60)
Glucose, Bld: 103 mg/dL — ABNORMAL HIGH (ref 70–99)
Potassium: 3.9 mmol/L (ref 3.5–5.1)
Sodium: 139 mmol/L (ref 135–145)

## 2018-10-23 LAB — CBC
HCT: 45.7 % (ref 39.0–52.0)
Hemoglobin: 15.4 g/dL (ref 13.0–17.0)
MCH: 30.8 pg (ref 26.0–34.0)
MCHC: 33.7 g/dL (ref 30.0–36.0)
MCV: 91.4 fL (ref 80.0–100.0)
Platelets: 199 10*3/uL (ref 150–400)
RBC: 5 MIL/uL (ref 4.22–5.81)
RDW: 13.5 % (ref 11.5–15.5)
WBC: 9.4 10*3/uL (ref 4.0–10.5)
nRBC: 0 % (ref 0.0–0.2)

## 2018-10-23 LAB — HIV ANTIBODY (ROUTINE TESTING W REFLEX): HIV Screen 4th Generation wRfx: NONREACTIVE

## 2018-10-23 NOTE — Plan of Care (Signed)
Patient diuresing well with IV lasix. Ambulated around nurses station x3 with standby assistance. DOE however O2 sats remained above 90%. HR up to 120 with exertion but returns WNL at rest.   Problem: Clinical Measurements: Goal: Ability to maintain clinical measurements within normal limits will improve Outcome: Progressing Goal: Respiratory complications will improve Outcome: Progressing   Problem: Education: Goal: Ability to demonstrate management of disease process will improve Outcome: Progressing   Problem: Activity: Goal: Capacity to carry out activities will improve Outcome: Progressing   Problem: Cardiac: Goal: Ability to achieve and maintain adequate cardiopulmonary perfusion will improve Outcome: Progressing

## 2018-10-23 NOTE — Progress Notes (Signed)
[facility] [place] NAME: Louis Huynh    MR#:  [id number]  DATE OF BIRTH:  Dec 15, 1960  SUBJECTIVE:  CHIEF COMPLAINT:   Chief Complaint  Patient presents with  . Leg Swelling   The patient has better shortness of breath, still leg edema. REVIEW OF SYSTEMS:  Review of Systems  Constitutional: Negative for chills, fever and malaise/fatigue.  HENT: Negative for sore throat.   Eyes: Negative for blurred vision and double vision.  Respiratory: Negative for cough, hemoptysis, sputum production, shortness of breath, wheezing and stridor.   Cardiovascular: Positive for leg swelling. Negative for chest pain, palpitations and orthopnea.  Gastrointestinal: Negative for abdominal pain, blood in stool, diarrhea, melena, nausea and vomiting.  Genitourinary: Negative for dysuria, flank pain and hematuria.  Musculoskeletal: Negative for back pain and joint pain.  Skin: Negative for rash.  Neurological: Negative for dizziness, sensory change, focal weakness, seizures, loss of consciousness, weakness and headaches.  Endo/Heme/Allergies: Negative for polydipsia.  Psychiatric/Behavioral: Negative for depression. The patient is not nervous/anxious.     DRUG ALLERGIES:  Not on File VITALS:  Blood pressure (!) 149/112, pulse (!) 51, temperature 98.5 F (36.9 C), temperature source Oral, resp. rate 20, height 5\' 8"  (1.727 m), weight 105 kg, SpO2 96 %. PHYSICAL EXAMINATION:  Physical Exam Constitutional:      General: He is not in acute distress.    Appearance: Normal appearance. He is obese.  HENT:     Head: Normocephalic.     Mouth/Throat:     Mouth: Mucous membranes are moist.  Eyes:     General: No scleral icterus.    Conjunctiva/sclera: Conjunctivae normal.     Pupils: Pupils are equal, round, and reactive to light.  Neck:     Musculoskeletal: Normal range of motion and neck supple.     Vascular: No JVD.     Trachea: No tracheal deviation.   Cardiovascular:     Rate and Rhythm: Normal rate and regular rhythm.     Heart sounds: Normal heart sounds. No murmur. No gallop.   Pulmonary:     Effort: Pulmonary effort is normal. No respiratory distress.     Breath sounds: Rales present. No wheezing.  Abdominal:     General: Bowel sounds are normal. There is no distension.     Palpations: Abdomen is soft.     Tenderness: There is no abdominal tenderness. There is no rebound.  Musculoskeletal: Normal range of motion.        General: No tenderness.     Right lower leg: Edema present.     Left lower leg: Edema present.  Skin:    Findings: No erythema or rash.  Neurological:     General: No focal deficit present.     Mental Status: He is alert and oriented to person, place, and time.     Cranial Nerves: No cranial nerve deficit.  Psychiatric:        Mood and Affect: Mood normal.    LABORATORY PANEL:  Male CBC Recent Labs  Lab 10/23/18 0618  WBC 9.4  HGB 15.4  HCT 45.7  PLT 199   ------------------------------------------------------------------------------------------------------------------ Chemistries  Recent Labs  Lab 10/23/18 0618  NA 139  K 3.9  CL 104  CO2 26  GLUCOSE 103*  BUN 39*  CREATININE 2.78*  CALCIUM 8.5*   RADIOLOGY:  No results found. ASSESSMENT AND PLAN:   1.  New onset acute systolic CHF, ejection fraction 25%. Continue  IV Lasix 40 mg twice daily.  Started Coreg but cannot start ACE inhibitor due to renal failure.   Echocardiograph report ejection fraction 25%.  2.  Hypertensive urgency.   Continue Lasix, added Coreg, Imdur and hydralazine p.o., PRN IV hydralazine.    3.  Acute kidney injury.  This could be cardiorenal syndrome due to acute CHF.    Improving. Follow-up BMP.  On Lasix.  4.  DVT prophylaxis.  Subcutaneous heparin. Obesity.  Diet control and exercise advised.  Follow-up PCP. All the records are reviewed and case discussed with Care Management/Social Worker. Management  plans discussed with the patient, his wife and they are in agreement.  CODE STATUS: Full Code  TOTAL TIME TAKING CARE OF THIS PATIENT: 36 minutes.   More than 50% of the time was spent in counseling/coordination of care: YES  POSSIBLE D/C IN 2 DAYS, DEPENDING ON CLINICAL CONDITION.   Demetrios Loll M.D on 10/23/2018 at 2:52 PM  Between 7am to 6pm - Pager - [phone]  After 6pm go to [email or web address] - [facility]

## 2018-10-23 NOTE — Progress Notes (Signed)
Spoke with patient regarding the outpatient HF Clinic. Explained services and patient was interested. An appointment was scheduled on 10/30/2018. Thank you.

## 2018-10-23 NOTE — Progress Notes (Signed)
Reds Reading: 41%

## 2018-10-23 NOTE — Consult Note (Signed)
[facility] - PHARMACIST COUNSELING NOTE  ADHERENCE ASSESSMENT  Adherence strategy: Patient uses a pillbox to organize his medication and reports that he is adherent to taking prescribed medications.    Do you ever forget to take your medication? [] Yes (1) [] No (0)  Do you ever skip doses due to side effects? [] Yes (1) [] No (0)  Do you have trouble affording your medicines? [] Yes (1) [] No (0)  Are you ever unable to pick up your medication due to transportation difficulties? [] Yes (1) [] No (0)  Do you ever stop taking your medications because you don't believe they are helping? [] Yes (1) [] No (0)  Total score _N/A_____    Recommendations given to patient about increasing adherence: N/A The above information was not discussed.  Guideline-Directed Medical Therapy/Evidence Based Medicine    ACE/ARB/ARNI: PTA losartan held d/t renal function   Beta Blocker: Carvedilol 6.25 mg BID   Aldosterone Antagonist: N/A Diuretic: Furosemide IV 40 mg BID     SUBJECTIVE   HPI: Patient presented with complaints LLE and SOB. He as a PMH significant for HTN. He has new onset systolic CHF with an EF of 25%   Past Medical History:  Diagnosis Date  . Hypertension         OBJECTIVE    Vital signs: HR  (ECG) 97-109, BP: SBP 129-164; DBP 96-112, weight (pounds) 238 >> 231 (down ~ 7 lbs)  ECHO: Date 10/22/2018, EF 20-25%, notes: (please see below)   1. The left ventricle has severely reduced systolic function, with an ejection fraction of 20-25%. The cavity size was normal. There is mildly increased left ventricular wall thickness. Left ventricular diastolic Doppler parameters are consistent with pseudonormalization. Left ventricular diffuse hypokinesis. 2. The right ventricle has normal systolic function. The cavity was mildly enlarged. There is no increase in right ventricular wall thickness. Right ventricular systolic pressure is mildly elevated with  an estimated pressure of 30.2 mmHg. 3. Left atrial size was mildly dilated.  BMP Latest Ref Rng & Units 10/23/2018 10/21/2018  Glucose 70 - 99 mg/dL 103(H) 120(H)  BUN 6 - 20 mg/dL 39(H) 44(H)  Creatinine 0.61 - 1.24 mg/dL 2.78(H) 3.15(H)  Sodium 135 - 145 mmol/L 139 139  Potassium 3.5 - 5.1 mmol/L 3.9 4.2  Chloride 98 - 111 mmol/L 104 107  CO2 22 - 32 mmol/L 26 23  Calcium 8.9 - 10.3 mg/dL 8.5(L) 8.9    ASSESSMENT Patient was receptive to the information that we discussed (medications, diet, fluid restriction, daily weights). He reported that he was checking his blood at home, but his machine broke and has not be able to regularly monitor his blood pressure. He plans to purchase another device. When reviewing with the patient when to contact his PCP/MD, based on weight changes, the patient was unsure- reinforcement was provided by pharmacist.    PLAN 1. When clinically appropriate, may consider restarting losartan and spironolactone.  Time spent: 15 minutes  Hatch, Pharm.D. Clinical Pharmacist 10/23/2018 11:23 AM   COUNSELING POINTS/CLINICAL PEARLS  Carvedilol (Goal: weight less than 85 kg is 25 mg BID, weight greater than 85 kg is 50 mg BID)  Patient should avoid activities requiring coordination until drug effects are realized, as drug may cause dizziness.  This drug may cause diarrhea, nausea, vomiting, arthralgia, back pain, myalgia, headache, vision disorder, erectile dysfunction, reduced libido, or fatigue.  Instruct patient to report signs/symptoms of adverse cardiovascular effects such as hypotension (especially in elderly patients), arrhythmias,  syncope, palpitations, angina, or edema.  Drug may mask symptoms of hypoglycemia. Advise diabetic patients to carefully monitor blood sugar levels.  Patient should take drug with food.  Advise patient against sudden discontinuation of drug.  Losartan (Goal: 150 mg once daily)  Warn male patient to avoid pregnancy and  to report a pregnancy that occurs during therapy.  Side effects may include dizziness, upper respiratory infection, nasal congestion, and back pain.  Warn patient to avoid use of potassium supplements or potassium-containing salt substitutes unless they consult healthcare provider.  Furosemide  Drug causes sun-sensitivity. Advise patient to use sunscreen and avoid tanning beds. Patient should avoid activities requiring coordination until drug effects are realized, as drug may cause dizziness, vertigo, or blurred vision. This drug may cause hyperglycemia, hyperuricemia, constipation, diarrhea, loss of appetite, nausea, vomiting, purpuric disorder, cramps, spasticity, asthenia, headache, paresthesia, or scaling eczema. Instruct patient to report unusual bleeding/bruising or signs/symptoms of hypotension, infection, pancreatitis, or ototoxicity (tinnitus, hearing impairment). Advise patient to report signs/symptoms of a severe skin reactions (flu-like symptoms, spreading red rash, or skin/mucous membrane blistering) or erythema multiforme. Instruct patient to eat high-potassium foods during drug therapy, as directed by healthcare professional.  Patient should not drink alcohol while taking this drug.  Spironolactone  Warn patient to report dehydration, hypotension, or symptoms of worsening renal function.  Counsel male patient to report gynecomastia.  Side effects may include diarrhea, nausea, vomiting, abdominal cramping, fever, leg cramps, lethargy, mental confusion, decreased libido, irregular menses, and rash. Suspension: Tell patient to take drug consistently with respect to food, either before or after a meal.  Advise patient to avoid potassium supplements and foods containing high levels of potassium, including salt substitutes. Marland Kitchen  DRUGS TO AVOID IN HEART FAILURE  Drug or Class Mechanism  Analgesics . NSAIDs . COX-2 inhibitors . Glucocorticoids  Sodium and water retention, increased  systemic vascular resistance, decreased response to diuretics   Diabetes Medications . Metformin . Thiazolidinediones o Rosiglitazone (Avandia) o Pioglitazone (Actos) . DPP4 Inhibitors o Saxagliptin (Onglyza) o Sitagliptin (Januvia)   Lactic acidosis Possible calcium channel blockade   Unknown  Antiarrhythmics . Class I  o Flecainide o Disopyramide . Class III o Sotalol . Other o Dronedarone  Negative inotrope, proarrhythmic   Proarrhythmic, beta blockade  Negative inotrope  Antihypertensives . Alpha Blockers o Doxazosin . Calcium Channel Blockers o Diltiazem o Verapamil o Nifedipine . Central Alpha Adrenergics o Moxonidine . Peripheral Vasodilators o Minoxidil  Increases renin and aldosterone  Negative inotrope    Possible sympathetic withdrawal  Unknown  Anti-infective . Itraconazole . Amphotericin B  Negative inotrope Unknown  Hematologic . Anagrelide . Cilostazol   Possible inhibition of PD IV Inhibition of PD III causing arrhythmias  Neurologic/Psychiatric . Stimulants . Anti-Seizure Drugs o Carbamazepine o Pregabalin . Antidepressants o Tricyclics o Citalopram . Parkinsons o Bromocriptine o Pergolide o Pramipexole . Antipsychotics o Clozapine . Antimigraine o Ergotamine o Methysergide . Appetite suppressants . Bipolar o Lithium  Peripheral alpha and beta agonist activity  Negative inotrope and chronotrope Calcium channel blockade  Negative inotrope, proarrhythmic Dose-dependent QT prolongation  Excessive serotonin activity/valvular damage Excessive serotonin activity/valvular damage Unknown  IgE mediated hypersensitivy, calcium channel blockade  Excessive serotonin activity/valvular damage Excessive serotonin activity/valvular damage Valvular damage  Direct myofibrillar degeneration, adrenergic stimulation  Antimalarials . Chloroquine . Hydroxychloroquine Intracellular inhibition of lysosomal enzymes  Urologic  Agents . Alpha Blockers o Doxazosin o Prazosin o Tamsulosin o Terazosin  Increased renin and aldosterone  Adapted from Page RL,  et al. "Drugs That May Cause or Exacerbate Heart Failure: A Scientific Statement from the [place]." Circulation 2016; 134:e32-e69. DOI: 10.1161/CIR.0000000000000426   MEDICATION ADHERENCES TIPS AND STRATEGIES 1. Taking medication as prescribed improves patient outcomes in heart failure (reduces hospitalizations, improves symptoms, increases survival) 2. Side effects of medications can be managed by decreasing doses, switching agents, stopping drugs, or adding additional therapy. Please let someone in the [facility] know if you have having bothersome side effects so we can modify your regimen. Do not alter your medication regimen without talking to [place].  3. Medication reminders can help patients remember to take drugs on time. If you are missing or forgetting doses you can try linking behaviors, using pill boxes, or an electronic reminder like an alarm on your phone or an app. Some people can also get automated phone calls as medication reminders.

## 2018-10-24 LAB — BASIC METABOLIC PANEL
Anion gap: 11 (ref 5–15)
BUN: 40 mg/dL — ABNORMAL HIGH (ref 6–20)
CO2: 26 mmol/L (ref 22–32)
Calcium: 8.4 mg/dL — ABNORMAL LOW (ref 8.9–10.3)
Chloride: 104 mmol/L (ref 98–111)
Creatinine, Ser: 2.64 mg/dL — ABNORMAL HIGH (ref 0.61–1.24)
GFR calc Af Amer: 30 mL/min — ABNORMAL LOW (ref >60)
GFR calc non Af Amer: 26 mL/min — ABNORMAL LOW (ref >60)
Glucose, Bld: 101 mg/dL — ABNORMAL HIGH (ref 70–99)
Potassium: 3.7 mmol/L (ref 3.5–5.1)
Sodium: 141 mmol/L (ref 135–145)

## 2018-10-24 LAB — MAGNESIUM: Magnesium: 2.2 mg/dL (ref 1.7–2.4)

## 2018-10-24 MED ORDER — CARVEDILOL 12.5 MG PO TABS
12.5000 mg | ORAL_TABLET | Freq: Two times a day (BID) | ORAL | Status: DC
  Administered 2018-10-24: 12.5 mg via ORAL
  Filled 2018-10-24: qty 1

## 2018-10-24 MED ORDER — HYDRALAZINE HCL 50 MG PO TABS
50.0000 mg | ORAL_TABLET | Freq: Three times a day (TID) | ORAL | Status: DC
  Administered 2018-10-24 – 2018-10-25 (×3): 50 mg via ORAL
  Filled 2018-10-24 (×3): qty 1

## 2018-10-24 NOTE — Progress Notes (Signed)
[facility] [place] NAME: Louis Huynh    MR#:  [id number]  DATE OF BIRTH:  06/26/1960  SUBJECTIVE:  CHIEF COMPLAINT:   Chief Complaint  Patient presents with  . Leg Swelling   No new complaint this morning.  No fevers.  Noted elevated blood pressure this morning.  Changes made to blood pressure medications.  Patient being given IV Lasix.  REVIEW OF SYSTEMS:  Review of Systems  Constitutional: Negative for chills and fever.  HENT: Negative for hearing loss and tinnitus.   Eyes: Negative for blurred vision and double vision.  Respiratory: Negative for cough.        Shortness of breath improved  Cardiovascular: Negative for chest pain and palpitations.  Gastrointestinal: Negative for heartburn and nausea.  Genitourinary: Negative for dysuria and urgency.  Musculoskeletal: Negative for myalgias and neck pain.  Skin: Negative for itching and rash.  Neurological: Negative for dizziness and headaches.  Psychiatric/Behavioral: Negative for depression and hallucinations.    DRUG ALLERGIES:  Not on File VITALS:  Blood pressure (!) 147/119, pulse 75, temperature 97.8 F (36.6 C), temperature source Oral, resp. rate (!) 22, height 5\' 8"  (1.727 m), weight 105.2 kg, SpO2 94 %. PHYSICAL EXAMINATION:  Physical Exam  Constitutional: He is oriented to person, place, and time. He appears well-developed.  HENT:  Head: Normocephalic.  Right Ear: External ear normal.  Left Ear: External ear normal.  Eyes: Pupils are equal, round, and reactive to light. Conjunctivae are normal. Right eye exhibits no discharge.  Neck: Normal range of motion. Neck supple. No thyromegaly present.  Cardiovascular: Normal rate, regular rhythm and normal heart sounds.  Respiratory: Effort normal. No respiratory distress.  GI: Soft. Bowel sounds are normal. He exhibits no distension.  Musculoskeletal: Normal range of motion.        General: No edema.  Neurological:  He is alert and oriented to person, place, and time. No cranial nerve deficit.  Skin: Skin is warm and dry. He is not diaphoretic. No erythema.  Psychiatric: He has a normal mood and affect. His behavior is normal.    LABORATORY PANEL:  Male CBC Recent Labs  Lab 10/23/18 0618  WBC 9.4  HGB 15.4  HCT 45.7  PLT 199   ------------------------------------------------------------------------------------------------------------------ Chemistries  Recent Labs  Lab 10/24/18 0649  NA 141  K 3.7  CL 104  CO2 26  GLUCOSE 101*  BUN 40*  CREATININE 2.64*  CALCIUM 8.4*  MG 2.2   RADIOLOGY:  No results found. ASSESSMENT AND PLAN:   1. New onset acute systolic CHF, ejection fraction 25%. Continue IV Lasix 40 mg twice daily.  Started Coreg but cannot start ACE inhibitor due to renal failure.  Echocardiograph report ejection fraction 25%. Continue isosorbide mononitrate and hydralazine. Seen by cardiologist.  Outpatient follow-up with cardiologist on discharge  2. Hypertensive urgency. Blood pressure uncontrolled this morning.  Increased dose of Coreg from 6.25 to 12.5 mg p.o. twice daily.  Increased hydralazine from 25 to 50 mg p.o. 3 times daily.  Continue Lasix. PRN IV hydralazine with parameters.  Monitor blood pressure closely.  3. Acute kidney injury. This could be cardiorenal syndrome due to acute CHF.   Improving. Follow-up BMP.  On Lasix.  DVT prophylaxis. Subcutaneous heparin.   All the records are reviewed and case discussed with Care Management/Social Worker. Management plans discussed with the patient, family and they are in agreement.  CODE STATUS: Full Code  TOTAL TIME TAKING CARE  OF THIS PATIENT: 36 minutes.   More than 50% of the time was spent in counseling/coordination of care: YES  POSSIBLE D/C IN 2 DAYS, DEPENDING ON CLINICAL CONDITION.   Solaris Kram M.D on 10/24/2018 at 2:33 PM  Between 7am to 6pm - Pager - [phone]  After 6pm go to  [email or web address] - Proofreader  [facility]  Office  [phone]  CC: Primary care physician; System, Pcp Not In  Note: This dictation was prepared with Dragon dictation along with smaller phrase technology. Any transcriptional errors that result from this process are unintentional.

## 2018-10-24 NOTE — Progress Notes (Signed)
Patient noted to have an irregular sinus rhythm.  EKG obtained and reviewed by Dr. Stark Jock. Determined to show little change from admission.

## 2018-10-25 LAB — BASIC METABOLIC PANEL WITH GFR
Anion gap: 10 (ref 5–15)
BUN: 37 mg/dL — ABNORMAL HIGH (ref 6–20)
CO2: 24 mmol/L (ref 22–32)
Calcium: 8.2 mg/dL — ABNORMAL LOW (ref 8.9–10.3)
Chloride: 103 mmol/L (ref 98–111)
Creatinine, Ser: 2.45 mg/dL — ABNORMAL HIGH (ref 0.61–1.24)
GFR calc Af Amer: 33 mL/min — ABNORMAL LOW
GFR calc non Af Amer: 28 mL/min — ABNORMAL LOW
Glucose, Bld: 100 mg/dL — ABNORMAL HIGH (ref 70–99)
Potassium: 3.4 mmol/L — ABNORMAL LOW (ref 3.5–5.1)
Sodium: 137 mmol/L (ref 135–145)

## 2018-10-25 LAB — MAGNESIUM: Magnesium: 2 mg/dL (ref 1.7–2.4)

## 2018-10-25 MED ORDER — HYDRALAZINE HCL 25 MG PO TABS
25.0000 mg | ORAL_TABLET | Freq: Once | ORAL | Status: AC
  Administered 2018-10-25: 25 mg via ORAL
  Filled 2018-10-25: qty 1

## 2018-10-25 MED ORDER — ISOSORBIDE MONONITRATE ER 30 MG PO TB24: 30.0000 mg | ORAL_TABLET | Freq: Every day | ORAL | 0 refills | Status: DC

## 2018-10-25 MED ORDER — HYDRALAZINE HCL 25 MG PO TABS: 75.0000 mg | ORAL_TABLET | Freq: Three times a day (TID) | ORAL | Status: DC

## 2018-10-25 MED ORDER — FUROSEMIDE 40 MG PO TABS: 40.0000 mg | ORAL_TABLET | Freq: Every day | ORAL | 0 refills | Status: DC

## 2018-10-25 MED ORDER — HYDRALAZINE HCL 25 MG PO TABS: 75.0000 mg | ORAL_TABLET | Freq: Three times a day (TID) | ORAL | 0 refills | Status: DC

## 2018-10-25 MED ORDER — CARVEDILOL 3.125 MG PO TABS: 3.1250 mg | ORAL_TABLET | Freq: Two times a day (BID) | ORAL | 0 refills | Status: DC

## 2018-10-25 MED ORDER — ASPIRIN 81 MG PO TBEC: 81.0000 mg | DELAYED_RELEASE_TABLET | Freq: Every day | ORAL | 0 refills | Status: DC

## 2018-10-25 NOTE — Discharge Instructions (Signed)

## 2018-10-25 NOTE — Discharge Summary (Signed)
[facility] NAME: Louis Huynh    MR#:  [id number]  DATE OF BIRTH:  11-13-1960  DATE OF ADMISSION:  10/21/2018   ADMITTING PHYSICIAN: Christel Mormon, MD  DATE OF DISCHARGE: 10/25/2018  PRIMARY CARE PHYSICIAN: System, Pcp Not In   ADMISSION DIAGNOSIS:  Dyspnea on exertion [R06.09] Elevated troponin 123XX123 Acute systolic congestive heart failure (HCC) [I50.21] Bilateral lower extremity edema [R60.0] DISCHARGE DIAGNOSIS:  Active Problems:   Acute CHF (congestive heart failure) ([place])  SECONDARY DIAGNOSIS:   Past Medical History:  Diagnosis Date   Hypertension    HOSPITAL COURSE:  Chief complaint; leg swelling  History of presenting complaint; [name]  is a 58 y.o. African-American male with a known history of hypertension, presented to the emergency room with acute onset of dyspnea with associated worsening lower extremity edema and orthopnea as well as dyspnea on exertion and paroxysmal nocturnal dyspnea.Troponin I was 213 and later 196.  COVID-19 test came back negative.  Chest x-ray showed cardiomegaly and interstitial pulmonary infiltrates concerning for pulmonary edema and acute CHF.  Patient was admitted to medical service for further evaluation.  Hospital course; 1. New onset acutesystolicCHF,ejection fraction 25%. Patient was adequately diuresed with IV Lasix 40 mg twice daily.  Seen by cardiologist.  Appreciate input.  Recommended continue Lasix 40 mg p.o. daily on discharge.  Patient also to continue with low dose of Coreg 3.125 mg p.o. twice daily with hold parameters to hold for heart rate less than 60.  No ACE inhibitor due to renal failure.Echocardiograph report ejection fraction 25%. Continue isosorbide mononitrate and hydralazine.  Patient significantly improved clinically.  Denies any shortness of breath.  Lower extremity edema improved.  Serial twelve-lead EKG is monitored remained stable.  Mildly  elevated troponin on presentation.  Felt to be secondary to demand ischemia versus hypoxia by cardiology.  No further cardiac intervention or diagnostics planned during this admission.  To follow-up with Dr. Nehemiah Massed in 1 week.  2. Hypertensive urgency. Blood pressure better controlled this morning.  Recent blood pressure of 145/91.  Dose of hydralazine was increased to 75 mg p.o. 3 times daily.  Continue low-dose of Coreg and Norvasc.  Outpatient follow-up with primary care physician for monitoring of blood pressure and adjustment of meds as needed.    3. Acute kidney injury. This could becardiorenal syndrome due to acute CHF.Improved. Outpatient follow-up monitoring by primary care physician.  Nephrology referral as outpatient as needed.  Patient clinically and hemodynamically stable and wishes to be discharged home today. DISCHARGE CONDITIONS:  Stable CONSULTS OBTAINED:   DRUG ALLERGIES:  Not on File DISCHARGE MEDICATIONS:   Allergies as of 10/25/2018   Not on File     Medication List    STOP taking these medications   HYDROcodone-acetaminophen 5-325 MG tablet Commonly known as: Norco   losartan 100 MG tablet Commonly known as: COZAAR   naproxen 500 MG tablet Commonly known as: Naprosyn   spironolactone 25 MG tablet Commonly known as: ALDACTONE     TAKE these medications   amitriptyline 50 MG tablet Commonly known as: ELAVIL Take 50 mg by mouth.   amLODipine 10 MG tablet Commonly known as: NORVASC Take 10 mg by mouth daily.   aspirin 81 MG EC tablet Take 1 tablet (81 mg total) by mouth daily. Start taking on: October 26, 2018   carvedilol 3.125 MG tablet Commonly known as: Coreg Take 1 tablet (3.125 mg total) by mouth 2 (two)  times daily. Hold for heart rate less than 60.   furosemide 40 MG tablet Commonly known as: Lasix Take 1 tablet (40 mg total) by mouth daily.   hydrALAZINE 25 MG tablet Commonly known as: APRESOLINE Take 3 tablets (75 mg  total) by mouth every 8 (eight) hours.   isosorbide mononitrate 30 MG 24 hr tablet Commonly known as: IMDUR Take 1 tablet (30 mg total) by mouth daily. Start taking on: October 26, 2018        DISCHARGE INSTRUCTIONS:   DIET:  Low-sodium diet weight 1.5 L daily fluid restriction DISCHARGE CONDITION:  Stable ACTIVITY:  Activity as tolerated OXYGEN:  Home Oxygen: No.  Oxygen Delivery: room air DISCHARGE LOCATION:  home   If you experience worsening of your admission symptoms, develop shortness of breath, life threatening emergency, suicidal or homicidal thoughts you must seek medical attention immediately by calling 911 or calling your MD immediately  if symptoms less severe.  You Must read complete instructions/literature along with all the possible adverse reactions/side effects for all the Medicines you take and that have been prescribed to you. Take any new Medicines after you have completely understood and accpet all the possible adverse reactions/side effects.   Please note  You were cared for by a hospitalist during your hospital stay. If you have any questions about your discharge medications or the care you received while you were in the hospital after you are discharged, you can call the unit and asked to speak with the hospitalist on call if the hospitalist that took care of you is not available. Once you are discharged, your primary care physician will handle any further medical issues. Please note that NO REFILLS for any discharge medications will be authorized once you are discharged, as it is imperative that you return to your primary care physician (or establish a relationship with a primary care physician if you do not have one) for your aftercare needs so that they can reassess your need for medications and monitor your lab values.    On the day of Discharge:  VITAL SIGNS:  Blood pressure (!) 145/91, pulse (!) 104, temperature 98.3 F (36.8 C), temperature source  Oral, resp. rate 19, height 5\' 8"  (1.727 m), weight 103.8 kg, SpO2 96 %. PHYSICAL EXAMINATION:  GENERAL:  58 y.o.-year-old patient lying in the bed with no acute distress.  EYES: Pupils equal, round, reactive to light and accommodation. No scleral icterus. Extraocular muscles intact.  HEENT: Head atraumatic, normocephalic. Oropharynx and nasopharynx clear.  NECK:  Supple, no jugular venous distention. No thyroid enlargement, no tenderness.  LUNGS: Normal breath sounds bilaterally, no wheezing, rales,rhonchi or crepitation. No use of accessory muscles of respiration.  CARDIOVASCULAR: S1, S2 normal. No murmurs, rubs, or gallops.  ABDOMEN: Soft, non-tender, non-distended. Bowel sounds present. No organomegaly or mass.  EXTREMITIES: No pedal edema, cyanosis, or clubbing.  NEUROLOGIC: Cranial nerves II through XII are intact. Muscle strength 5/5 in all extremities. Sensation intact. Gait not checked.  PSYCHIATRIC: The patient is alert and oriented x 3.  SKIN: No obvious rash, lesion, or ulcer.  DATA REVIEW:   CBC Recent Labs  Lab 10/23/18 0618  WBC 9.4  HGB 15.4  HCT 45.7  PLT 199    Chemistries  Recent Labs  Lab 10/25/18 0411  NA 137  K 3.4*  CL 103  CO2 24  GLUCOSE 100*  BUN 37*  CREATININE 2.45*  CALCIUM 8.2*  MG 2.0     Microbiology Results  Results for  orders placed or performed during the hospital encounter of 10/21/18  SARS Coronavirus 2 [facility] order, Performed in [facility] hospital lab) Nasopharyngeal Nasopharyngeal Swab     Status: None   Collection Time: 10/22/18 12:48 AM   Specimen: Nasopharyngeal Swab  Result Value Ref Range Status   SARS Coronavirus 2 NEGATIVE NEGATIVE Final    Comment: (NOTE) If result is NEGATIVE SARS-CoV-2 target nucleic acids are NOT DETECTED. The SARS-CoV-2 RNA is generally detectable in upper and lower  respiratory specimens during the acute phase of infection. The lowest  concentration of SARS-CoV-2 viral copies this assay can  detect is 250  copies / mL. A negative result does not preclude SARS-CoV-2 infection  and should not be used as the sole basis for treatment or other  patient management decisions.  A negative result may occur with  improper specimen collection / handling, submission of specimen other  than nasopharyngeal swab, presence of viral mutation(s) within the  areas targeted by this assay, and inadequate number of viral copies  (<250 copies / mL). A negative result must be combined with clinical  observations, patient history, and epidemiological information. If result is POSITIVE SARS-CoV-2 target nucleic acids are DETECTED. The SARS-CoV-2 RNA is generally detectable in upper and lower  respiratory specimens dur ing the acute phase of infection.  Positive  results are indicative of active infection with SARS-CoV-2.  Clinical  correlation with patient history and other diagnostic information is  necessary to determine patient infection status.  Positive results do  not rule out bacterial infection or co-infection with other viruses. If result is PRESUMPTIVE POSTIVE SARS-CoV-2 nucleic acids MAY BE PRESENT.   A presumptive positive result was obtained on the submitted specimen  and confirmed on repeat testing.  While 2019 novel coronavirus  (SARS-CoV-2) nucleic acids may be present in the submitted sample  additional confirmatory testing may be necessary for epidemiological  and / or clinical management purposes  to differentiate between  SARS-CoV-2 and other Sarbecovirus currently known to infect humans.  If clinically indicated additional testing with an alternate test  methodology [phone]) is advised. The SARS-CoV-2 RNA is generally  detectable in upper and lower respiratory sp ecimens during the acute  phase of infection. The expected result is Negative. Fact Sheet for Patients:  StrictlyIdeas.no Fact Sheet for Healthcare  Providers: BankingDealers.co.za This test is not yet approved or cleared by the [place] FDA and has been authorized for detection and/or diagnosis of SARS-CoV-2 by FDA under an Emergency Use Authorization (EUA).  This EUA will remain in effect (meaning this test can be used) for the duration of the COVID-19 declaration under Section 564(b)(1) of the Act, 21 U.S.C. section 360bbb-3(b)(1), unless the authorization is terminated or revoked sooner. Performed at [facility], [address] [place], [place] [address]     RADIOLOGY:  No results found.   Management plans discussed with the patient, family and they are in agreement.  CODE STATUS: Full Code   TOTAL TIME TAKING CARE OF THIS PATIENT: 38 minutes.    Leonardo Plaia M.D on 10/25/2018 at 10:47 AM  Between 7am to 6pm - Pager - [phone]  After 6pm go to [email or web address] - Proofreader  [facility]  Office  [phone]  CC: Primary care physician; System, Pcp Not In   Note: This dictation was prepared with Dragon dictation along with smaller phrase technology. Any transcriptional errors that result from this process are unintentional.

## 2018-10-25 NOTE — Progress Notes (Signed)
EKG done. Texted Gardiner Barefoot NP to review and advise. No acute distress noted. Will continue to monitor.

## 2018-10-29 NOTE — Progress Notes (Deleted)
   Patient ID: Louis Huynh, male    DOB: 05-24-1960, 58 y.o.   MRN: [id number]  HPI  Louis Huynh is a 58 y/o male with a history of  Echo report from 10/22/2018 reviewed and showed an EF of 20-25% along with mildly elevated PA pressure of 30.2 mmHg.   Admitted 10/21/2018 due to new onset HF along with HTN. Cardiology consult obtained. Initially given IV lasix and then transitioned to oral diuretics. Discharged after 4 days.  He presents today for his initial visit with a chief complaint of   Review of Systems    Physical Exam    Assessment & Plan:  1: Chronic heart failure with reduced ejection fraction- - NYHA class - BNP 10/21/2018 was 822.0  2: HTN- - BP  - BMP 10/25/2018 reviewed and showed sodium 137, potassium 3.4, creatinine 2.45 and GFR 33

## 2018-10-30 ENCOUNTER — Ambulatory Visit: Payer: Federal, State, Local not specified - PPO | Admitting: Family

## 2018-11-04 NOTE — Progress Notes (Signed)
Patient ID: Louis Huynh, male    DOB: [birth year], 58 y.o.   MRN: [id number]  HPI  Mr. Coppedge is a 58 year old male with a history of hypertension and new onset CHF with reduced ejection fraction.   Echo from 10/22/18 reviewed and showed an EF of 20-25%  Hospital admission 10/21/18 with new onset CHF, shortness of breath, lower extremity edema. Treated with IV lasix and cardiology consulted. Discharged 4 days later on PO lasix.  He presents here today for an initial visit with complaints of minimal shortness of breath on moderate exertion and abdominal distention that is getting better. He denies fatigue, changes in appetite, cough, dizziness, leg edema, sleep disturbances.   He states overall he is feeling a lot better since his admission to the hospital. He weighs himself everyday at home and his weight has been stable between 223-225 pounds. He has been watching how much fluid he is drinking and limits it to 8 cups per day. He and his wife also have been reading nutrition labels for sodium and does not add salt.  He has been walking a few days a week on his treadmill. He works as a Curator.   Past Medical History:  Diagnosis Date  . CHF (congestive heart failure) ([place])   . Hypertension   . PTSD (post-traumatic stress disorder)    No past surgical history on file.   Family History  Problem Relation Age of Onset  . Hypertension Mother   . Hypertension Sister   . Hypertension Brother   . Heart attack Brother   . Heart failure Brother    Social History   Tobacco Use  . Smoking status: Never Smoker  . Smokeless tobacco: Never Used  Substance Use Topics  . Alcohol use: Yes    Comment: occasionally   No Known Allergies   Prior to Admission Medications  Carvedilol 3.125mg  BID Losartan 100mg  daily Amlodipine 10mg  daily Sprionolcatone 25mg  daily Amitryptiline 50mg  daily Aspirin 81mg  daily  Review of Systems  Constitutional: Negative for appetite change  and fatigue.  HENT: Negative for congestion and rhinorrhea.   Eyes: Negative.   Respiratory: Positive for shortness of breath (improving last few weeks). Negative for cough.   Cardiovascular: Negative for chest pain and leg swelling.  Gastrointestinal: Positive for abdominal distention (getting better). Negative for abdominal pain.  Endocrine: Negative.   Genitourinary: Negative.   Musculoskeletal: Negative.   Skin: Negative.   Allergic/Immunologic: Negative.   Neurological: Negative for dizziness and light-headedness.  Hematological: Negative.   Psychiatric/Behavioral: Negative for sleep disturbance (2 pillows). The patient is not nervous/anxious.    Today's Vitals   11/09/18 1352 11/09/18 1353 11/09/18 1400  BP: (!) 152/114 (!) 141/99 (!) 132/98  Pulse: 94    Resp: 18    SpO2: 99%    Weight: 233 lb (105.7 kg)    Height: 5\' 8"  (1.727 m)     Body mass index is 35.43 kg/m.   Filed Weights   11/09/18 1352  Weight: 233 lb (105.7 kg)   Lab Results  Component Value Date   CREATININE 2.45 (H) 10/25/2018   CREATININE 2.64 (H) 10/24/2018   CREATININE 2.78 (H) 10/23/2018   Physical Exam Vitals signs and nursing note reviewed.  Constitutional:      Appearance: Normal appearance.  HENT:     Head: Normocephalic and atraumatic.  Neck:     Musculoskeletal: Normal range of motion.  Cardiovascular:     Rate and Rhythm: Normal rate and  regular rhythm.  Abdominal:     General: There is distension (mild).     Tenderness: There is no abdominal tenderness.  Musculoskeletal:        General: No tenderness.     Right lower leg: Edema (2+ pitting edema) present.     Left lower leg: Edema (2+ pitting edema) present.  Skin:    General: Skin is warm.  Neurological:     Mental Status: He is alert and oriented to person, place, and time.  Psychiatric:        Mood and Affect: Mood normal.        Behavior: Behavior normal.     Assessment & Plan  1: Chronic heart failure with reduced  ejection fraction- - NYHA class II - mild fluid overload - Reminded to weigh daily and call for an overnight weight gain of >2 pounds or a weekly weight gain of >5 pounds - Weight stable at home 223-225 pounds since discharge  - Educated to follow 2,000mg  sodium diet - Start torsemide 20mg  daily - Increase carvedilol to 6.25 mg BID. May take 2 tablets twice a day of current medication bottle, then take 1 tablet BID with new bottle - check BMP today - BNP on 10/21/18 was 822.0 - BMP on 10/25/18 showed sodium 137, potassium 3.4, creatinine 2.45, GFR 33 - After checking BMP, may change losartan to entresto - Next visit may start Bidil - Cardiologist at the [place]. In process of making an appointment   2: Hypertension- - today BP 132/98 manually - PCP at the [place]. In process of making an appointment   3: Chronic Kidney Disease Stage 3- - Creatinine was 3.15 on 10/21/18, down to 2.45 on 10/25/18 - recheck BMP today  4: Lymphedema- - Elevate legs when sitting at home - Wear compression stockings. Patient states he will buy these as soon as possible  Medication bottles reviewed at this visit  Follow up in 2 weeks or sooner for any questions/problems before then.

## 2018-11-09 ENCOUNTER — Ambulatory Visit: Payer: Federal, State, Local not specified - PPO | Admitting: Family

## 2018-11-09 ENCOUNTER — Encounter: Payer: Self-pay | Admitting: Family

## 2018-11-09 ENCOUNTER — Other Ambulatory Visit
Admission: RE | Admit: 2018-11-09 | Discharge: 2018-11-09 | Disposition: A | Discharge status: Home / Self Care | Payer: Federal, State, Local not specified - PPO | Source: Ambulatory Visit | Attending: Family | Admitting: Family

## 2018-11-09 ENCOUNTER — Other Ambulatory Visit: Payer: Self-pay

## 2018-11-09 VITALS — BP 132/98 | HR 94 | Resp 18 | Ht 68.0 in | Wt 233.0 lb

## 2018-11-09 DIAGNOSIS — Z8249 Family history of ischemic heart disease and other diseases of the circulatory system: Secondary | ICD-10-CM | POA: Insufficient documentation

## 2018-11-09 DIAGNOSIS — I890 Lymphedema, not elsewhere classified: Secondary | ICD-10-CM | POA: Diagnosis not present

## 2018-11-09 DIAGNOSIS — I5022 Chronic systolic (congestive) heart failure: Secondary | ICD-10-CM

## 2018-11-09 DIAGNOSIS — I130 Hypertensive heart and chronic kidney disease with heart failure and stage 1 through stage 4 chronic kidney disease, or unspecified chronic kidney disease: Secondary | ICD-10-CM | POA: Insufficient documentation

## 2018-11-09 DIAGNOSIS — N183 Chronic kidney disease, stage 3 unspecified: Secondary | ICD-10-CM

## 2018-11-09 DIAGNOSIS — I129 Hypertensive chronic kidney disease with stage 1 through stage 4 chronic kidney disease, or unspecified chronic kidney disease: Secondary | ICD-10-CM

## 2018-11-09 DIAGNOSIS — Z79899 Other long term (current) drug therapy: Secondary | ICD-10-CM | POA: Diagnosis not present

## 2018-11-09 DIAGNOSIS — Z7982 Long term (current) use of aspirin: Secondary | ICD-10-CM | POA: Insufficient documentation

## 2018-11-09 DIAGNOSIS — I10 Essential (primary) hypertension: Secondary | ICD-10-CM

## 2018-11-09 DIAGNOSIS — F4310 Post-traumatic stress disorder, unspecified: Secondary | ICD-10-CM | POA: Diagnosis not present

## 2018-11-09 LAB — BASIC METABOLIC PANEL
Anion gap: 11 (ref 5–15)
BUN: 32 mg/dL — ABNORMAL HIGH (ref 6–20)
CO2: 24 mmol/L (ref 22–32)
Calcium: 9 mg/dL (ref 8.9–10.3)
Chloride: 107 mmol/L (ref 98–111)
Creatinine, Ser: 2.58 mg/dL — ABNORMAL HIGH (ref 0.61–1.24)
GFR calc Af Amer: 31 mL/min — ABNORMAL LOW (ref >60)
GFR calc non Af Amer: 27 mL/min — ABNORMAL LOW (ref >60)
Glucose, Bld: 85 mg/dL (ref 70–99)
Potassium: 4 mmol/L (ref 3.5–5.1)
Sodium: 142 mmol/L (ref 135–145)

## 2018-11-09 MED ORDER — TORSEMIDE 20 MG PO TABS: 20.0000 mg | ORAL_TABLET | Freq: Every day | ORAL | 5 refills | Status: DC

## 2018-11-09 MED ORDER — CARVEDILOL 6.25 MG PO TABS: 6.2500 mg | ORAL_TABLET | Freq: Two times a day (BID) | ORAL | 5 refills | Status: DC

## 2018-11-09 NOTE — Patient Instructions (Signed)
Continue weigh daily and call for an overnight weight gain of >2 pounds or a weekly weight gain of >5 pounds.  Increase coreg to 6.25mg  BID. May take 2 pills twice a day until you run out of this bottle and then start 1 pill twice a day.  Start torsemide 20mg  daily (fluid pill)  Wear ted stocking (support socks)   Check your kidneys and potassium today with labs

## 2018-11-10 ENCOUNTER — Telehealth: Payer: Self-pay | Admitting: Family

## 2018-11-10 ENCOUNTER — Encounter: Payer: Self-pay | Admitting: Family

## 2018-11-10 NOTE — Telephone Encounter (Signed)
LM on patient's voicemail regarding lab results. Will decrease his losartan to 50mg  daily (1/2 tablet) and will check BMP at his next visit.

## 2018-11-23 NOTE — Progress Notes (Deleted)
Patient ID: Louis Huynh, male    DOB: Jul 27, 1960, 58 y.o.   MRN: [id number]  HPI  Louis Huynh is a 58 year old male with a history of hypertension and new onset CHF with reduced ejection fraction.   Echo from 10/22/18 reviewed and showed an EF of 20-25%  Hospital admission 10/21/18 with new onset CHF, shortness of breath, lower extremity edema. Treated with IV lasix and cardiology consulted. Discharged 4 days later on PO lasix.  He presents here today for a follow-up visit with complaints of   Past Medical History:  Diagnosis Date  . CHF (congestive heart failure) ([place])   . Chronic kidney disease   . Hypertension   . PTSD (post-traumatic stress disorder)    No past surgical history on file.   Family History  Problem Relation Age of Onset  . Hypertension Mother   . Hypertension Sister   . Hypertension Brother   . Heart attack Brother   . Heart failure Brother    Social History   Tobacco Use  . Smoking status: Never Smoker  . Smokeless tobacco: Never Used  Substance Use Topics  . Alcohol use: Yes    Comment: occasionally   No Known Allergies     Review of Systems  Constitutional: Negative for appetite change and fatigue.  HENT: Negative for congestion and rhinorrhea.   Eyes: Negative.   Respiratory: Positive for shortness of breath (improving last few weeks). Negative for cough.   Cardiovascular: Negative for chest pain and leg swelling.  Gastrointestinal: Positive for abdominal distention (getting better). Negative for abdominal pain.  Endocrine: Negative.   Genitourinary: Negative.   Musculoskeletal: Negative.   Skin: Negative.   Allergic/Immunologic: Negative.   Neurological: Negative for dizziness and light-headedness.  Hematological: Negative.   Psychiatric/Behavioral: Negative for sleep disturbance (2 pillows). The patient is not nervous/anxious.     Physical Exam Vitals signs and nursing note reviewed.  Constitutional:      Appearance: Normal  appearance.  HENT:     Head: Normocephalic and atraumatic.  Neck:     Musculoskeletal: Normal range of motion.  Cardiovascular:     Rate and Rhythm: Normal rate and regular rhythm.  Abdominal:     General: There is distension (mild).     Tenderness: There is no abdominal tenderness.  Musculoskeletal:        General: No tenderness.     Right lower leg: Edema (2+ pitting edema) present.     Left lower leg: Edema (2+ pitting edema) present.  Skin:    General: Skin is warm.  Neurological:     Mental Status: He is alert and oriented to person, place, and time.  Psychiatric:        Mood and Affect: Mood normal.        Behavior: Behavior normal.     Assessment & Plan  1: Chronic heart failure with reduced ejection fraction- - NYHA class II - mild fluid overload - Reminded to weigh daily and call for an overnight weight gain of >2 pounds or a weekly weight gain of >5 pounds - Weight stable at home 223-225 pounds since discharge  - Educated to follow 2,000mg  sodium diet - Start torsemide 20mg  daily - Increase carvedilol to 6.25 mg BID. May take 2 tablets twice a day of current medication bottle, then take 1 tablet BID with new bottle - check BMP today - BNP on 10/21/18 was 822.0 - BMP on 11/09/18 showed sodium 142, potassium 4.0, creatinine 2.58,  GFR 31 - After checking BMP, may change losartan to entresto - Next visit may start Bidil - Cardiologist at the [place]. In process of making an appointment   2: Hypertension- - today BP 132/98 manually - PCP at the [place]. In process of making an appointment   3: Chronic Kidney Disease Stage 3- - Creatinine 2.58 on 11/09/2018 - recheck BMP today  4: Lymphedema- - Elevate legs when sitting at home - Wear compression stockings. Patient states he will buy these as soon as possible  Medication bottles reviewed at this visit  Follow up in 2 weeks or sooner for any questions/problems before then.

## 2018-11-25 ENCOUNTER — Ambulatory Visit: Payer: Federal, State, Local not specified - PPO | Admitting: Family

## 2018-11-25 NOTE — Progress Notes (Signed)
Patient ID: Louis Huynh, male    DOB: 01/11/1961, 58 y.o.   MRN: [id number]  HPI  Mr. Lapietra is a 58 year old male with a history of hypertension and new onset CHF with reduced ejection fraction.   Echo from 10/22/18 reviewed and showed an EF of 20-25%  Hospital admission 10/21/18 with new onset CHF, shortness of breath, lower extremity edema. Treated with IV lasix and cardiology consulted. Discharged 4 days later on PO lasix.  He presents here today for a follow-up visit and states he is doing well. He denies shortness of breath, chest pain, appetite change, fatigue, leg swelling, abdominal distention, dizziness, and trouble sleeping. He weighs himself every day and fluctuates between 2 pounds. He has not been adding salt to his foods and has been eating more vegetables. He tries to exercise, however says it's hard on days that he works.   Past Medical History:  Diagnosis Date  . CHF (congestive heart failure) ([place])   . Chronic kidney disease   . Hypertension   . PTSD (post-traumatic stress disorder)    No past surgical history on file.   Family History  Problem Relation Age of Onset  . Hypertension Mother   . Hypertension Sister   . Hypertension Brother   . Heart attack Brother   . Heart failure Brother    Social History   Tobacco Use  . Smoking status: Never Smoker  . Smokeless tobacco: Never Used  Substance Use Topics  . Alcohol use: Yes    Comment: occasionally   No Known Allergies   Prior to Admission medications   Medication Sig Start Date End Date Taking? Authorizing Provider  amitriptyline (ELAVIL) 50 MG tablet Take 50 mg by mouth.   Yes [provider]  amLODipine (NORVASC) 10 MG tablet Take 1 tablet (10 mg total) by mouth daily. 11/30/18  Yes Darylene Price A, FNP  aspirin EC 81 MG EC tablet Take 1 tablet (81 mg total) by mouth daily. 10/26/18  Yes Ojie, Jude, MD  carvedilol (COREG) 6.25 MG tablet Take 1 tablet (6.25 mg total) by mouth 2 (two)  times daily. 11/09/18  Yes Hackney, Otila Kluver A, FNP  losartan (COZAAR) 100 MG tablet Take 50 mg by mouth daily.   Yes [provider]  spironolactone (ALDACTONE) 25 MG tablet Take 1 tablet (25 mg total) by mouth daily. 11/30/18  Yes Darylene Price A, FNP  torsemide (DEMADEX) 20 MG tablet Take 1 tablet (20 mg total) by mouth daily. 11/09/18 02/07/19 Yes Alisa Graff, FNP    Review of Systems  Constitutional: Negative for appetite change and fatigue.  HENT: Negative for congestion and rhinorrhea.   Eyes: Negative.   Respiratory: Negative for cough and shortness of breath.   Cardiovascular: Negative for chest pain and leg swelling.  Gastrointestinal: Negative for abdominal pain. Abdominal distention: getting better.  Endocrine: Negative.   Genitourinary: Negative.   Musculoskeletal: Negative.   Skin: Negative.   Allergic/Immunologic: Negative.   Neurological: Negative for dizziness and light-headedness.  Hematological: Negative.   Psychiatric/Behavioral: Negative for sleep disturbance (2 pillows). The patient is not nervous/anxious.    Vitals:   11/30/18 1343  BP: (!) 156/118  Pulse: (!) 106  Resp: 18  SpO2: 98%   Filed Weights   11/30/18 1343  Weight: 231 lb 6 oz (105 kg)   Lab Results  Component Value Date   CREATININE 2.58 (H) 11/09/2018   CREATININE 2.45 (H) 10/25/2018   CREATININE 2.64 (H) 10/24/2018  Physical Exam Vitals signs and nursing note reviewed.  Constitutional:      Appearance: Normal appearance.  HENT:     Head: Normocephalic and atraumatic.  Neck:     Musculoskeletal: Normal range of motion.  Cardiovascular:     Rate and Rhythm: Normal rate and regular rhythm.  Abdominal:     General: There is distension (mild).     Tenderness: There is no abdominal tenderness.  Musculoskeletal:        General: No tenderness.     Right lower leg: Edema (2+ pitting edema) present.     Left lower leg: Edema (2+ pitting edema) present.  Skin:    General: Skin  is warm.  Neurological:     Mental Status: He is alert and oriented to person, place, and time.  Psychiatric:        Mood and Affect: Mood normal.        Behavior: Behavior normal.     Assessment & Plan  1: Chronic heart failure with reduced ejection fraction- - NYHA class I - mild fluid overload - Reminded to weigh daily and call for an overnight weight gain of >2 pounds or a weekly weight gain of >5 pounds - Weight stable at home 223-225 pounds since discharge  - Educated to follow 2,000mg  sodium diet - Taking torsemide 20mg  daily - Increase carvedilol to 12.5 mg BID. May take 2 tablets twice a day of current medication bottle, then take 1 tablet BID with new bottle - check BMP today - If kidney function is stable, will start entresto 24-26mg  twice a day and stop losartan. He was given entresto voucher today and will call patient with the lab results. - BNP on 10/21/2018 was 822.0 - BMP on 11/09/2018 showed sodium 142, potassium 4.0, creatinine 2.58, GFR 31 - After checking BMP, may change losartan to entresto - Next visit may start Bidil - Cardiologist at the [place]. In process of making an appointment   2: Hypertension- - today BP 154/104 manually - He did take his medications today. - Refilled amlodipine 10mg  PO daily #30 with 3 refills and spironolactone 25mg  PO daily #30 with 3 refills - PCP at the [place]. In process of making an appointment   3: Chronic Kidney Disease Stage 3- - Creatinine was 2.58 on 11/09/2018 - recheck BMP today  4: Lymphedema- - Elevate legs when sitting at home - Patient states he did not buy compression socks yet. He will go today to buy some when he picks up his medications.   Medication bottles reviewed at this visit  Follow up in 2 weeks or sooner for any questions/problems before then.

## 2018-11-27 ENCOUNTER — Ambulatory Visit: Payer: Federal, State, Local not specified - PPO | Admitting: Family

## 2018-11-30 ENCOUNTER — Encounter: Payer: Self-pay | Admitting: Family

## 2018-11-30 ENCOUNTER — Other Ambulatory Visit: Payer: Self-pay

## 2018-11-30 ENCOUNTER — Ambulatory Visit: Payer: Federal, State, Local not specified - PPO | Attending: Family | Admitting: Family

## 2018-11-30 VITALS — BP 156/118 | HR 106 | Resp 18 | Ht 68.0 in | Wt 231.4 lb

## 2018-11-30 DIAGNOSIS — Z8249 Family history of ischemic heart disease and other diseases of the circulatory system: Secondary | ICD-10-CM | POA: Insufficient documentation

## 2018-11-30 DIAGNOSIS — I129 Hypertensive chronic kidney disease with stage 1 through stage 4 chronic kidney disease, or unspecified chronic kidney disease: Secondary | ICD-10-CM

## 2018-11-30 DIAGNOSIS — Z7901 Long term (current) use of anticoagulants: Secondary | ICD-10-CM | POA: Diagnosis not present

## 2018-11-30 DIAGNOSIS — I130 Hypertensive heart and chronic kidney disease with heart failure and stage 1 through stage 4 chronic kidney disease, or unspecified chronic kidney disease: Secondary | ICD-10-CM | POA: Insufficient documentation

## 2018-11-30 DIAGNOSIS — N183 Chronic kidney disease, stage 3 unspecified: Secondary | ICD-10-CM

## 2018-11-30 DIAGNOSIS — I10 Essential (primary) hypertension: Secondary | ICD-10-CM

## 2018-11-30 DIAGNOSIS — Z79899 Other long term (current) drug therapy: Secondary | ICD-10-CM | POA: Diagnosis not present

## 2018-11-30 DIAGNOSIS — I5022 Chronic systolic (congestive) heart failure: Secondary | ICD-10-CM | POA: Diagnosis present

## 2018-11-30 DIAGNOSIS — I890 Lymphedema, not elsewhere classified: Secondary | ICD-10-CM

## 2018-11-30 DIAGNOSIS — Z7982 Long term (current) use of aspirin: Secondary | ICD-10-CM | POA: Diagnosis not present

## 2018-11-30 LAB — BASIC METABOLIC PANEL
Anion gap: 10 (ref 5–15)
BUN: 36 mg/dL — ABNORMAL HIGH (ref 6–20)
CO2: 29 mmol/L (ref 22–32)
Calcium: 9.7 mg/dL (ref 8.9–10.3)
Chloride: 102 mmol/L (ref 98–111)
Creatinine, Ser: 2.9 mg/dL — ABNORMAL HIGH (ref 0.61–1.24)
GFR calc Af Amer: 27 mL/min — ABNORMAL LOW (ref >60)
GFR calc non Af Amer: 23 mL/min — ABNORMAL LOW (ref >60)
Glucose, Bld: 89 mg/dL (ref 70–99)
Potassium: 4 mmol/L (ref 3.5–5.1)
Sodium: 141 mmol/L (ref 135–145)

## 2018-11-30 MED ORDER — AMLODIPINE BESYLATE 10 MG PO TABS: 10.0000 mg | ORAL_TABLET | Freq: Every day | ORAL | 3 refills | Status: DC

## 2018-11-30 MED ORDER — SPIRONOLACTONE 25 MG PO TABS: 25.0000 mg | ORAL_TABLET | Freq: Every day | ORAL | 3 refills | Status: DC

## 2018-11-30 NOTE — Patient Instructions (Addendum)
Continue weighing daily and call for an overnight weight gain of >2 pounds or a weekly weight gain of >5 pounds.  Increase carvedilol to 12.5mg  twice a day. You may take 2 pills 6.25 twice a day until you run out of this bottle, then start 1 pill 12.5 mg twice a day with the new bottle.  Get blood work today. Louis Huynh will call tomorrow with the results and to determine if we can stop your losartan and start entresto 1 pill twice a day.

## 2018-12-01 ENCOUNTER — Telehealth: Payer: Self-pay | Admitting: Family

## 2018-12-01 MED ORDER — BIDIL 20-37.5 MG PO TABS: 1.0000 | ORAL_TABLET | Freq: Three times a day (TID) | ORAL | 3 refills | Status: DC

## 2018-12-01 NOTE — Telephone Encounter (Signed)
LM on patient's voicemail (per patient's request) about labs that were drawn yesterday. Potassium level is good although renal function is worsening. Losartan was decreased previously due to renal function and now BP is elevated.   Had wanted to change his losartan to entresto but will hold off doing that at this time. Patient continues with lower extremity edema so not comfortable stopping his torsemide or his spironolactone.   Will add bidil 1 tablet three times daily. RX sent to [facility] in [place]. Will recheck BMP at his next visit with [place] on 10/9/220.

## 2018-12-17 NOTE — Progress Notes (Deleted)
Patient ID: Louis Huynh, male    DOB: 1960-04-22, 58 y.o.   MRN: [id number]  HPI  [name] is a 58 year old male with a history of hypertension and new onset CHF with reduced ejection fraction.   Echo from 10/22/18 reviewed and showed an EF of 20-25%  Hospital admission 10/21/18 with new onset CHF, shortness of breath, lower extremity edema. Treated with IV lasix and cardiology consulted. Discharged 4 days later on PO lasix.  He presents here today for a follow-up visit with a chief complaint of   Past Medical History:  Diagnosis Date  . CHF (congestive heart failure) ([place])   . Chronic kidney disease   . Hypertension   . PTSD (post-traumatic stress disorder)    No past surgical history on file.   Family History  Problem Relation Age of Onset  . Hypertension Mother   . Hypertension Sister   . Hypertension Brother   . Heart attack Brother   . Heart failure Brother    Social History   Tobacco Use  . Smoking status: Never Smoker  . Smokeless tobacco: Never Used  Substance Use Topics  . Alcohol use: Yes    Comment: occasionally   No Known Allergies     Review of Systems  Constitutional: Negative for appetite change and fatigue.  HENT: Negative for congestion and rhinorrhea.   Eyes: Negative.   Respiratory: Negative for cough and shortness of breath.   Cardiovascular: Negative for chest pain and leg swelling.  Gastrointestinal: Negative for abdominal pain. Abdominal distention: getting better.  Endocrine: Negative.   Genitourinary: Negative.   Musculoskeletal: Negative.   Skin: Negative.   Allergic/Immunologic: Negative.   Neurological: Negative for dizziness and light-headedness.  Hematological: Negative.   Psychiatric/Behavioral: Negative for sleep disturbance (2 pillows). The patient is not nervous/anxious.      Physical Exam Vitals signs and nursing note reviewed.  Constitutional:      Appearance: Normal appearance.  HENT:     Head:  Normocephalic and atraumatic.  Neck:     Musculoskeletal: Normal range of motion.  Cardiovascular:     Rate and Rhythm: Normal rate and regular rhythm.  Abdominal:     General: There is distension (mild).     Tenderness: There is no abdominal tenderness.  Musculoskeletal:        General: No tenderness.     Right lower leg: Edema (2+ pitting edema) present.     Left lower leg: Edema (2+ pitting edema) present.  Skin:    General: Skin is warm.  Neurological:     Mental Status: He is alert and oriented to person, place, and time.  Psychiatric:        Mood and Affect: Mood normal.        Behavior: Behavior normal.     Assessment & Plan  1: Chronic heart failure with reduced ejection fraction- - NYHA class I - mild fluid overload - Reminded to weigh daily and call for an overnight weight gain of >2 pounds or a weekly weight gain of >5 pounds - Weight 231.6 from last visit here 2 weeks ago - Educated to follow 2,000mg  sodium diet - Taking torsemide 20mg  daily - carvedilol increased to 12.5 mg BID - bidil TID begun after getting most recent lab results back - BNP on 10/21/2018 was 822.0 - BMP on 11/30/2018 showed sodium 141, potassium 4.0, creatinine 2.90, GFR 27 - will repeat labs today - Cardiologist at the [place]. In process of making  an appointment   2: Hypertension- - BP. - PCP at the [place]. In process of making an appointment   3: Lymphedema- - Elevate legs when sitting at home - Patient states he did not buy compression socks yet. He will go today to buy some when he picks up his medications.   Medication bottles reviewed at this visit

## 2018-12-18 ENCOUNTER — Ambulatory Visit: Payer: Federal, State, Local not specified - PPO | Admitting: Family

## 2018-12-18 ENCOUNTER — Telehealth: Payer: Self-pay | Admitting: Family

## 2018-12-18 NOTE — Telephone Encounter (Signed)
Patient did not show for his [facility] appointment on 12/18/2018. Will attempt to reschedule.

## 2019-02-05 NOTE — Progress Notes (Deleted)
Patient ID: Louis Huynh, male    DOB: 1960/03/13, 58 y.o.   MRN: [id number]  HPI  [name] is a 58 year old male with a history of hypertension and new onset CHF with reduced ejection fraction.   Echo from 10/22/18 reviewed and showed an EF of 20-25%  Hospital admission 10/21/18 with new onset CHF, shortness of breath, lower extremity edema. Treated with IV lasix and cardiology consulted. Discharged 4 days later on PO lasix.  He presents here today for a follow-up visit and states he is doing well.   Past Medical History:  Diagnosis Date  . CHF (congestive heart failure) ([place])   . Chronic kidney disease   . Hypertension   . PTSD (post-traumatic stress disorder)    No past surgical history on file.   Family History  Problem Relation Age of Onset  . Hypertension Mother   . Hypertension Sister   . Hypertension Brother   . Heart attack Brother   . Heart failure Brother    Social History   Tobacco Use  . Smoking status: Never Smoker  . Smokeless tobacco: Never Used  Substance Use Topics  . Alcohol use: Yes    Comment: occasionally   No Known Allergies     Review of Systems  Constitutional: Negative for appetite change and fatigue.  HENT: Negative for congestion and rhinorrhea.   Eyes: Negative.   Respiratory: Negative for cough and shortness of breath.   Cardiovascular: Negative for chest pain and leg swelling.  Gastrointestinal: Negative for abdominal pain. Abdominal distention: getting better.  Endocrine: Negative.   Genitourinary: Negative.   Musculoskeletal: Negative.   Skin: Negative.   Allergic/Immunologic: Negative.   Neurological: Negative for dizziness and light-headedness.  Hematological: Negative.   Psychiatric/Behavioral: Negative for sleep disturbance (2 pillows). The patient is not nervous/anxious.      Physical Exam Vitals signs and nursing note reviewed.  Constitutional:      Appearance: Normal appearance.  HENT:     Head:  Normocephalic and atraumatic.  Neck:     Musculoskeletal: Normal range of motion.  Cardiovascular:     Rate and Rhythm: Normal rate and regular rhythm.  Abdominal:     General: There is distension (mild).     Tenderness: There is no abdominal tenderness.  Musculoskeletal:        General: No tenderness.     Right lower leg: Edema (2+ pitting edema) present.     Left lower leg: Edema (2+ pitting edema) present.  Skin:    General: Skin is warm.  Neurological:     Mental Status: He is alert and oriented to person, place, and time.  Psychiatric:        Mood and Affect: Mood normal.        Behavior: Behavior normal.     Assessment & Plan  1: Chronic heart failure with reduced ejection fraction- - NYHA class I - mild fluid overload - Reminded to weigh daily and call for an overnight weight gain of >2 pounds or a weekly weight gain of >5 pounds - Weight 231.6 from last visit here ~2 months ago  - Educated to follow 2,000mg  sodium diet - BNP on 10/21/2018 was 822.0 - BMP on 11/30/2018 showed sodium 141, potassium 4.0, creatinine 2.90, GFR 27 -  - Cardiologist at the [place]. In process of making an appointment   2: Hypertension- - BP - PCP at the [place]. In process of making an appointment   3: Chronic Kidney  Disease Stage 3- - renal function worsening  - needs nephrology referral  4: Lymphedema- - Elevate legs when sitting at home - Patient states he did not buy compression socks yet. He will go today to buy some when he picks up his medications.   Medication bottles reviewed at this visit

## 2019-02-08 ENCOUNTER — Telehealth: Payer: Self-pay | Admitting: Family

## 2019-02-08 ENCOUNTER — Ambulatory Visit: Payer: Federal, State, Local not specified - PPO | Admitting: Family

## 2019-02-08 NOTE — Telephone Encounter (Signed)
Patient did not show for his [facility] Failure Clinic appointment on 02/08/2019. Will attempt to reschedule.

## 2019-02-23 ENCOUNTER — Other Ambulatory Visit: Payer: Self-pay | Admitting: Family

## 2019-03-09 ENCOUNTER — Other Ambulatory Visit: Payer: Self-pay | Admitting: Family

## 2019-03-09 ENCOUNTER — Telehealth: Payer: Self-pay | Admitting: Family

## 2019-03-09 NOTE — Telephone Encounter (Signed)
LVM with Patient to attempt to reschedule his No Show Follow up appt we had scheduled on 11/30. Asked patient to call call back asap.    Alyse Low, [facility]

## 2019-03-15 ENCOUNTER — Other Ambulatory Visit: Payer: Self-pay | Admitting: Family

## 2019-05-11 ENCOUNTER — Other Ambulatory Visit: Payer: Self-pay | Admitting: Family

## 2019-05-25 ENCOUNTER — Other Ambulatory Visit: Payer: Self-pay | Admitting: Family

## 2019-10-19 DIAGNOSIS — I248 Other forms of acute ischemic heart disease: Secondary | ICD-10-CM | POA: Insufficient documentation

## 2019-10-19 DIAGNOSIS — N179 Acute kidney failure, unspecified: Secondary | ICD-10-CM | POA: Insufficient documentation

## 2019-10-25 DIAGNOSIS — E871 Hypo-osmolality and hyponatremia: Secondary | ICD-10-CM | POA: Insufficient documentation

## 2019-10-25 HISTORY — PX: RIGHT HEART CATH: SHX6075

## 2019-12-27 ENCOUNTER — Other Ambulatory Visit: Payer: Self-pay | Admitting: Family

## 2020-01-19 DIAGNOSIS — N189 Chronic kidney disease, unspecified: Secondary | ICD-10-CM | POA: Insufficient documentation

## 2020-01-19 DIAGNOSIS — I509 Heart failure, unspecified: Secondary | ICD-10-CM | POA: Insufficient documentation

## 2020-01-21 HISTORY — PX: RIGHT HEART CATH: SHX6075

## 2020-02-14 DIAGNOSIS — G4730 Sleep apnea, unspecified: Secondary | ICD-10-CM | POA: Insufficient documentation

## 2020-02-14 DIAGNOSIS — L678 Other hair color and hair shaft abnormalities: Secondary | ICD-10-CM | POA: Insufficient documentation

## 2020-04-18 DIAGNOSIS — R972 Elevated prostate specific antigen [PSA]: Secondary | ICD-10-CM | POA: Insufficient documentation

## 2020-05-26 ENCOUNTER — Telehealth: Payer: Self-pay | Admitting: *Deleted

## 2020-05-26 NOTE — Telephone Encounter (Signed)
error 

## 2020-06-05 ENCOUNTER — Ambulatory Visit: Payer: Self-pay | Admitting: Surgery

## 2020-06-08 ENCOUNTER — Encounter: Payer: Self-pay | Admitting: *Deleted

## 2020-07-05 ENCOUNTER — Ambulatory Visit: Payer: Federal, State, Local not specified - PPO | Admitting: Surgery

## 2020-07-10 ENCOUNTER — Other Ambulatory Visit: Payer: Self-pay

## 2020-07-10 ENCOUNTER — Encounter: Payer: Self-pay | Admitting: Surgery

## 2020-07-10 ENCOUNTER — Ambulatory Visit (INDEPENDENT_AMBULATORY_CARE_PROVIDER_SITE_OTHER): Payer: Federal, State, Local not specified - PPO | Admitting: Surgery

## 2020-07-10 VITALS — BP 167/108 | HR 49 | Temp 98.3°F | Ht 68.0 in | Wt 223.8 lb

## 2020-07-10 DIAGNOSIS — Z992 Dependence on renal dialysis: Secondary | ICD-10-CM | POA: Diagnosis not present

## 2020-07-10 DIAGNOSIS — N186 End stage renal disease: Secondary | ICD-10-CM | POA: Diagnosis not present

## 2020-07-10 NOTE — Patient Instructions (Addendum)
Our surgery scheduler Pamala Hurry will call you within 24-48 hours to get you scheduled. If you have not heard from her after 48 hours, please call our office. You will not need to get Covid tested before surgery and have the blue sheet available when she calls to write down important information. A Cardiac Clearance has been faxed to Dr. Posey Pronto at [facility].  If you have any concerns or questions, please feel free to call our office.    Peritoneal Dialysis Catheter Placement  Peritoneal dialysis catheter placement is a surgery to insert a thin, flexible tube (catheter) into the abdomen. The catheter will be used for peritoneal dialysis, which is a process for filtering the blood. The catheter is small, soft, and easy to conceal. The catheter placement is usually done at least 2 weeks before peritoneal dialysis is started. During dialysis, wastes, salt, and extra water are removed from the blood. In peritoneal dialysis, these tasks are performed by transferring a fluid (dialysate) to and from the abdomen during each session. The fluid goes through the catheter to enter the abdomen at the start of each dialysis session, and it drains out of the body through the catheter at the end of each session. This procedure is done using one of the following techniques:  Open technique. This is when the surgery is performed through one large incision.  Laparoscopic technique. This is when smaller incisions are made and a tube with a light and camera (laparoscope) is inserted through one of the incisions to help perform the surgery. The camera sends images to a video screen in the operating room. This lets the surgeon see inside the abdomen during the procedure. Tell a health care provider about:  Any allergies you have.  All medicines you are taking, including vitamins, herbs, eye drops, creams, and over-the-counter medicines.  Any problems you or family members have had with anesthetic medicines.  Any blood  disorders you have.  Any surgeries you have had.  Any history of smoking.  Any medical conditions you have.  Whether you are pregnant or may be pregnant. What are the risks? Generally, this is a safe procedure. However, problems may occur, including:  Infection.  Too much bleeding.  A collection of blood near the incision (hematoma).  Damage to blood vessels, tissues, or organs in the abdomen area.  Allergic reactions to medicines.  Pain or cramping.  Slow healing.  Catheter problems after the surgery. The catheter may: ? Become blocked. ? Move out of place. ? Poke or wrap around intestines. ? Allow fluid to leak around it.  Scarring.  Skin damage. What happens before the procedure? Staying hydrated Follow instructions from your health care provider about hydration, which may include:  Up to 2 hours before the procedure - you may continue to drink clear liquids, such as water, clear fruit juice, black coffee, and plain tea.   Eating and drinking restrictions Follow instructions from your health care provider about eating and drinking, which may include:  8 hours before the procedure - stop eating heavy meals or foods, such as meat, fried foods, or fatty foods.  6 hours before the procedure - stop eating light meals or foods, such as toast or cereal.  6 hours before the procedure - stop drinking milk or drinks that contain milk.  2 hours before the procedure - stop drinking clear liquids. Medicines Ask your health care provider about:  Changing or stopping your regular medicines. This is especially important if you are taking diabetes  medicines or blood thinners.  Taking medicines such as aspirin and ibuprofen. These medicines can thin your blood. Do not take these medicines unless your health care provider tells you to take them.  Taking over-the-counter medicines, vitamins, herbs, and supplements. Surgery safety Ask your health care provider:  How your  surgery site will be marked.  What steps will be taken to help prevent infection. These steps may include: ? Removing hair at the surgery site. ? Washing skin with a germ-killing soap. ? Taking antibiotic medicine. General instructions  You may have a CT scan or ultrasound of your abdomen.  You may have a blood sample taken.  Plan to have a responsible adult take you home from the hospital or clinic.  Your health care provider will discuss the best site for the catheter to be placed. The site will be chosen: ? To help prevent the catheter from being flattened or damaged. ? To make it as comfortable as possible for you. What happens during the procedure?  An IV will be inserted into one of your veins.  You will be given one or both of the following: ? A medicine to help you relax (sedative). ? A medicine to numb the area (local anesthetic).  If you are having an open surgery, one large incision will be made in the abdomen.  If you are having laparoscopic surgery, small incisions will be made in the abdomen. A laparoscope and instruments will be put through the incisions.  The catheter will be put in place.  A short tunnel will be made under the skin to a location where the catheter exits the abdomen.  Stitches (sutures) will be placed around the catheter to hold it in place.  Your incisions will be closed with sutures or staples. The procedure may vary among health care providers and hospitals. What happens after the procedure?  Your blood pressure, heart rate, breathing rate, and blood oxygen level will be monitored until you leave the hospital or clinic.  You may have some pain. You will be given pain medicine as needed.  You will be given instructions about how to care for your catheter and how it is used for the dialysis process. Summary  Peritoneal dialysis catheter placement is a surgery to insert a thin, flexible tube (catheter) in your abdomen. This surgery must  be done before you begin peritoneal dialysis.  Before the procedure, your health care provider will discuss the best site for the catheter to be placed. The site will be chosen to help prevent the catheter from being flattened or damaged, and to make it as comfortable as possible for you.  After the procedure, you will be given instructions about how to care for your catheter and how it is used for the dialysis process. This information is not intended to replace advice given to you by your health care provider. Make sure you discuss any questions you have with your health care provider. Document Revised: 10/14/2019 Document Reviewed: 10/14/2019 Elsevier Patient Education  2021 [place].    AV Fistula Placement  Arteriovenous (AV) fistula placement is a surgical procedure to create a connection between a blood vessel that carries blood away from the heart (artery) and a blood vessel that returns blood to the heart (vein). This connection is called a fistula. It is often made in the forearm or upper arm. You may need this procedure if you are getting hemodialysis treatments for kidney disease. An AV fistula makes your vein larger and stronger  over several months. This makes the vein a safe and easy spot to insert the needles that are used for hemodialysis. Tell a health care provider about:  Any allergies you have.  All medicines you are taking, including vitamins, herbs, eye drops, creams, and over-the-counter medicines.  Any problems you or family members have had with anesthetic medicines.  Any blood disorders you have.  Any surgeries you have had.  Any medical conditions you have or have had in the past.  Whether you are pregnant or may be pregnant. What are the risks? Generally, this is a safe procedure. However, problems may occur, including:  Infection.  Blood clot.  Reduced blood flow (stenosis).  Weakening or ballooning out of the fistula  (aneurysm).  Bleeding.  Allergic reactions to medicines.  Nerve damage.  Swelling near the fistula.  Failure of the procedure. What happens before the procedure? Staying hydrated Follow instructions from your health care provider about hydration, which may include:  Up to 2 hours before the procedure - you may continue to drink clear liquids, such as water, clear fruit juice, black coffee, and plain tea.   Eating and drinking restrictions Follow instructions from your health care provider about eating and drinking, which may include:  8 hours before the procedure - stop eating heavy meals or foods, such as meat, fried foods, or fatty foods.  6 hours before the procedure - stop eating light meals or foods, such as toast or cereal.  6 hours before the procedure - stop drinking milk or drinks that contain milk.  2 hours before the procedure - stop drinking clear liquids. Medicines Ask your health care provider about:  Changing or stopping your regular medicines. This is especially important if you are taking diabetes medicines or blood thinners.  Taking medicines such as aspirin and ibuprofen. These medicines can thin your blood. Do not take these medicines unless your health care provider tells you to take them.  Taking over-the-counter medicines, vitamins, herbs, and supplements. General instructions  Do not use any products that contain nicotine or tobacco for at least 4 weeks before the procedure. These products include cigarettes, chewing tobacco, and vaping devices, such as e-cigarettes. If you need help quitting, ask your health care provider.  Imaging tests of your arm may be done to find the best place for the fistula.  Plan to have a responsible adult take you home from the hospital or clinic.  Ask your health care provider: ? How your surgery site will be marked. ? What steps will be taken to help prevent infection. These steps may include:  Removing hair at the  surgery site.  Washing skin with a germ-killing soap.  Taking antibiotic medicine. What happens during the procedure?  An IV will be inserted into one of your veins.  You will be given one or more of the following: ? A medicine to help you relax (sedative). ? A medicine to numb the area (local anesthetic). ? A medicine to make you fall asleep (general anesthetic). ? A medicine that is injected into an area of your body to numb everything below the injection site (regional anesthetic).  An incision will be made on the inner side of your arm.  A vein and an artery will be opened and connected with stitches (sutures).  The incision will be closed with sutures or clips.  A bandage (dressing) will be placed over the area. The procedure may vary among health care providers and hospitals. What happens after the  procedure?  Your blood pressure, heart rate, breathing rate, and blood oxygen level may be monitored until you leave the hospital or clinic.  Your fistula site will be checked for bleeding or swelling.  You will be given pain medicine as needed.  If you were given a sedative during the procedure, it can affect you for several hours. Do not drive or operate machinery until your health care provider says that it is safe. Summary  Arteriovenous (AV) fistula placement is a surgical procedure to create a connection between a blood vessel that carries blood away from your heart (artery) and a blood vessel that returns blood to your heart (vein). This connection is called a fistula.  Follow instructions from your health care provider about eating and drinking before the procedure.  Ask your health care provider about changing or stopping your regular medicines before the procedure. This is especially important if you are taking diabetes medicines or blood thinners.  Plan to have a responsible adult take you home from the hospital or clinic. This information is not intended to  replace advice given to you by your health care provider. Make sure you discuss any questions you have with your health care provider. Document Revised: 10/06/2019 Document Reviewed: 10/06/2019 Elsevier Patient Education  [place].

## 2020-07-10 NOTE — Progress Notes (Signed)
07/10/2020  Reason for Visit:  Peritoneal dialysis catheter  Referring Provider:  Murlean Iba, MD  History of Present Illness: Louis Huynh is a 60 y.o. male presenting for evaluation of peritoneal dialysis catheter placement.  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  He was admitted at [facility] on 04/26/20 with AKI and CHF exacerbation, with significant edema and swelling.  He was started on hemodialysis via temporary catheter placement.  He is undergoing HD three times a week.  He does feel better than during his admission, though each dialysis session does tire him.  He was seen by Dr. Candiss Norse and he's had a discussion about HD vs PD.  He seems to be leaning towards PD, but his significant other may prefer HD.  Today, the patient reports that he's under the weather with a cold and cough, though denies any fevers, chills, chest pain, shortness of breath.  He's vaccinated for COVID-19.  Of note, he's currently on the transplant list at [facility] for both heart and kidney.  There was concern for possible prostate cancer, but reports that final diagnosis was benign.  Past Medical History: Past Medical History:  Diagnosis Date  . CHF (congestive heart failure) ([place])   . Chronic kidney disease   . Hypertension   . PTSD (post-traumatic stress disorder)      Past Surgical History: --No surgeries  Home Medications: Prior to Admission medications   Medication Sig Start Date End Date Taking? Authorizing Provider  amitriptyline (ELAVIL) 50 MG tablet Take 50 mg by mouth.   Yes [provider]  aspirin EC 81 MG EC tablet Take 1 tablet (81 mg total) by mouth daily. 10/26/18  Yes Ojie, Jude, MD  carvedilol (COREG) 12.5 MG tablet Take 1 tablet (12.5 mg total) by mouth 2 (two) times daily. MUST MAKE APPOINTMENT FOR FURTHER REFILLS 05/11/19  Yes Darylene Price A, FNP  hydrochlorothiazide (HYDRODIURIL) 50 MG tablet Take 50 mg by mouth  daily.   Yes [provider]  isosorbide-hydrALAZINE (BIDIL) 20-37.5 MG tablet Take 1 tablet by mouth 3 (three) times daily. 12/01/18  Yes Hackney, Otila Kluver A, FNP  losartan (COZAAR) 100 MG tablet Take 50 mg by mouth daily.   Yes [provider]    Allergies: No Known Allergies  Social History:  reports that he has never smoked. He has never used smokeless tobacco. He reports current alcohol use. He reports previous drug use.   Family History: Family History  Problem Relation Age of Onset  . Hypertension Mother   . Hypertension Sister   . Hypertension Brother   . Heart attack Brother   . Heart failure Brother     Review of Systems: Review of Systems  Constitutional: Negative for chills and fever.  HENT: Negative for hearing loss.   Respiratory: Positive for cough and sputum production. Negative for shortness of breath.   Cardiovascular: Negative for chest pain.  Gastrointestinal: Negative for abdominal pain, constipation, diarrhea, nausea and vomiting.  Genitourinary: Negative for dysuria.  Musculoskeletal: Negative for myalgias.  Skin: Negative for rash.  Neurological: Negative for dizziness.  Psychiatric/Behavioral: Negative for depression.    Physical Exam BP (!) 167/108   Pulse (!) 49   Temp 98.3 F (36.8 C) (Oral)   Ht '5\' 8"'$  (1.727 m)   Wt 223 lb 12.8 oz (101.5 kg)   SpO2 95%   BMI 34.03 kg/m  CONSTITUTIONAL: No acute distress HEENT:  Normocephalic, atraumatic, extraocular motion  intact. NECK: Trachea is midline, and there is no jugular venous distension.  RESPIRATORY:  Lungs are clear, and breath sounds are equal bilaterally. Normal respiratory effort without pathologic use of accessory muscles. CARDIOVASCULAR: Heart is regular without murmurs, gallops, or rubs. GI: The abdomen is soft, non-distended, non-tender to palpation.  He may have a very small umbilical hernia.  MUSCULOSKELETAL:  Normal muscle strength and tone in all four extremities.  No  peripheral edema or cyanosis. SKIN: Skin turgor is normal. There are no pathologic skin lesions.  NEUROLOGIC:  Motor and sensation is grossly normal.  Cranial nerves are grossly intact. PSYCH:  Alert and oriented to person, place and time. Affect is normal.  Laboratory Analysis: Labs from his [place] admission -- 05/03/20: Na 140, K 4.8, Cl 103, CO2 28, BUN 61, Cr 5.5, Mg 2.0.  WBC 11.7, Hgb 13.2, Hct 38.7, Plt 269.  Imaging: U/S renal 04/26/20: Impression:  1. No hydronephrosis or nephrolithiasis.  2. Diffusely increased echogenicity of the renal parenchyma bilaterally as can be seen in setting of medical renal disease.  3. Incidentally noted small right pleural effusion.  CXR 04/26/20: FINDINGS/IMPRESSION:   1. Mediastinal and cardiac contours are enlarged, unchanged.  2. Pulmonary venous hypertension, without evidence of frank pulmonary edema. No focal pulmonary opacities.  3. No large effusion or pneumothorax.  4. Degenerative changes of the thoracic spine.   IR HD catheter 05/01/20: IMPRESSION:  Successful placement of a right internal jugular vein 23 cm tip to cuff  14.5 [place] [employer] hemodialysis tunneled central venous catheter. The catheter is ready for immediate use.   Assessment and Plan: This is a 60 y.o. male with progressive chronic kidney disease, now requiring dialysis.  --Discussed with the patient more about both AV fistulas and PD catheters.  Discussed that with PD catheters, his treatment is at the comfort of home, with his own timing, not depending on a dialysis center for access and transportation to get there.  Also, PD is more transportable is he is out on vacation, vs having to find where the nearest HD center is wherever he travels.  However, there's maintenance/care that he has to do and there's risk of infection.  He is leaning more towards PD catheter, but after today's discussion with me, he will talk more with his wife and call us by the end of the week to  confirm his decision. --In the meantime, he is in agreement to tentatively schedule surgery.  He has an appointment with cardiology as part of hospital follow up on May 19th.  Discussed with him that we would like to get clearance from him first to make sure he's ok for surgery.  We'll also send medical clearance to his PCP.  Based on this, will schedule him for laparoscopic assisted PD catheter placement on 08/03/20.  Being vaccinated for COVID-19, he would not need testing prior to surgery.  He is on Aspirin daily, and would have to stop with last dose on 07/28/20. --Will await his phone call.  Face-to-face time spent with the patient and care providers was 80 minutes, with more than 50% of the time spent counseling, educating, and coordinating care of the patient.     Melvyn Neth, [facility]

## 2020-07-11 ENCOUNTER — Telehealth: Payer: Self-pay | Admitting: Surgery

## 2020-07-11 NOTE — Telephone Encounter (Signed)
Outgoing call is made, left message for patient to call.  Patient will be calling back on Friday May 6th 2022 and he will let us know if he wants to proceed with doing the surgery on 5/26 or cancel.  If patient decides to proceed with surgery please advise of the following:   Please advise patient of Pre-Admission date/time, COVID Testing date and Surgery date.  Surgery Date: 08/03/20 Preadmission Testing Date: 07/26/20 (phone 8a-1p) Covid Testing Date: Not needed as patient has been fully vaccinated.    Also per Dr. Hampton Abbot if patient decides to proceed with surgery on 5/26 his last dose of aspirin needs to be on 07/28/20.    Patient also to call at [phone], between 1-3:00pm the day before surgery, to find out what time to arrive for surgery.

## 2020-07-17 ENCOUNTER — Telehealth: Payer: Self-pay | Admitting: *Deleted

## 2020-07-17 NOTE — Telephone Encounter (Signed)
Patient called back and was seen on 07/10/20 by Dr Hampton Abbot and was told to call back to decide what he wanted to do. He wants to proceed with the fistula in the arm.

## 2020-07-20 ENCOUNTER — Telehealth: Payer: Self-pay

## 2020-07-20 NOTE — Telephone Encounter (Signed)
Received call from [place] office-she let me know patient has appointment to see DR.Patel 07/27/20 -Cardiac Clearance was faxed -

## 2020-07-24 ENCOUNTER — Telehealth: Payer: Self-pay | Admitting: Surgery

## 2020-07-24 ENCOUNTER — Other Ambulatory Visit: Payer: Self-pay

## 2020-07-24 DIAGNOSIS — N186 End stage renal disease: Secondary | ICD-10-CM

## 2020-07-24 NOTE — Telephone Encounter (Signed)
Patient has called and decided not to do the PD catheter placement scheduled on 08/03/20.  He will be doing the fistula arm instead.  At patient's request surgery with Dr. Hampton Abbot is cancelled.  We have placed a referral to [facility] and patient has been notified that they will be calling him to set up an appointment for consult.

## 2020-07-24 NOTE — Telephone Encounter (Signed)
Referral has been placed to [facility]. They will contact the patient to see about placing an AV fistula for dialysis access.

## 2020-07-26 ENCOUNTER — Other Ambulatory Visit: Admission: RE | Admit: 2020-07-26 | Payer: Federal, State, Local not specified - PPO | Source: Ambulatory Visit

## 2020-08-03 ENCOUNTER — Ambulatory Visit: Admit: 2020-08-03 | Payer: Federal, State, Local not specified - PPO | Admitting: Surgery

## 2020-08-03 SURGERY — LAPAROSCOPIC INSERTION CONTINUOUS AMBULATORY PERITONEAL DIALYSIS  (CAPD) CATHETER
Anesthesia: General

## 2020-08-04 NOTE — Progress Notes (Signed)
Cardiac Clearance has been received from Dr Abigail Miyamoto office. The patient is cleared at Medium risk.

## 2020-08-27 ENCOUNTER — Other Ambulatory Visit (INDEPENDENT_AMBULATORY_CARE_PROVIDER_SITE_OTHER): Payer: Self-pay | Admitting: Vascular Surgery

## 2020-08-27 DIAGNOSIS — N186 End stage renal disease: Secondary | ICD-10-CM

## 2020-08-29 DIAGNOSIS — N186 End stage renal disease: Secondary | ICD-10-CM | POA: Insufficient documentation

## 2020-08-29 DIAGNOSIS — I2510 Atherosclerotic heart disease of native coronary artery without angina pectoris: Secondary | ICD-10-CM | POA: Insufficient documentation

## 2020-08-29 NOTE — Progress Notes (Signed)
MRN : [id number]  Lapatrick Huynh is a 60 y.o. (19-Jun-1960) male who presents with chief complaint of No chief complaint on file. Marland Kitchen  History of Present Illness:    The patient is seen for evaluation of dialysis access.    He dialyzes Mon, Wed and Friday  Current access is via a catheter which is functioning well. This is his initial catheter and was placed April 11, 2020. There have not been any episodes of catheter infection.  The patient denies amaurosis fugax or recent TIA symptoms. There are no recent neurological changes noted.  The patient denies claudication symptoms or rest pain symptoms. The patient denies history of DVT, PE or superficial thrombophlebitis. The patient denies recent episodes of angina or shortness of breath.    No outpatient medications have been marked as taking for the 08/31/20 encounter (Appointment) with Delana Meyer, Dolores Lory, MD.    Past Medical History:  Diagnosis Date   CHF (congestive heart failure) ([place])    Chronic kidney disease    Hypertension    PTSD (post-traumatic stress disorder)     No past surgical history on file.  Social History Social History   Tobacco Use   Smoking status: Never   Smokeless tobacco: Never  Substance Use Topics   Alcohol use: Yes    Comment: occasionally   Drug use: Not Currently    Family History Family History  Problem Relation Age of Onset   Hypertension Mother    Hypertension Sister    Hypertension Brother    Heart attack Brother    Heart failure Brother     No Known Allergies   REVIEW OF SYSTEMS (Negative unless checked)  Constitutional: '[]'$ Weight loss  '[]'$ Fever  '[]'$ Chills Cardiac: '[]'$ Chest pain   '[]'$ Chest pressure   '[]'$ Palpitations   '[]'$ Shortness of breath when laying flat   '[]'$ Shortness of breath with exertion. Vascular:  '[]'$ Pain in legs with walking   '[]'$ Pain in legs at rest  '[]'$ History of DVT   '[]'$ Phlebitis   '[]'$ Swelling in legs   '[]'$ Varicose veins   '[]'$ Non-healing ulcers Pulmonary:   '[]'$ Uses  home oxygen   '[]'$ Productive cough   '[]'$ Hemoptysis   '[]'$ Wheeze  '[]'$ COPD   '[]'$ Asthma Neurologic:  '[]'$ Dizziness   '[]'$ Seizures   '[]'$ History of stroke   '[]'$ History of TIA  '[]'$ Aphasia   '[]'$ Vissual changes   '[]'$ Weakness or numbness in arm   '[]'$ Weakness or numbness in leg Musculoskeletal:   '[]'$ Joint swelling   '[x]'$ Joint pain   '[]'$ Low back pain Hematologic:  '[]'$ Easy bruising  '[]'$ Easy bleeding   '[]'$ Hypercoagulable state   '[]'$ Anemic Gastrointestinal:  '[]'$ Diarrhea   '[]'$ Vomiting  '[]'$ Gastroesophageal reflux/heartburn   '[]'$ Difficulty swallowing. Genitourinary:  '[x]'$ Chronic kidney disease   '[]'$ Difficult urination  '[]'$ Frequent urination   '[]'$ Blood in urine Skin:  '[]'$ Rashes   '[]'$ Ulcers  Psychological:  '[]'$ History of anxiety   '[]'$  History of major depression.  Physical Examination  There were no vitals filed for this visit. There is no height or weight on file to calculate BMI. Gen: WD/WN, NAD Head: [place]/AT, No temporalis wasting.  Ear/Nose/Throat: Hearing grossly intact, nares w/o erythema or drainage Eyes: PER, EOMI, sclera nonicteric.  Neck: Supple, no large masses.   Pulmonary:  Good air movement, no audible wheezing bilaterally, no use of accessory muscles.  Cardiac: RRR, no JVD Vascular:   right IJ catheter CD&I,  left cephalic vein is palpable and visible in the antecubital fossa Vessel Right Left  Radial Palpable Palpable  Ulnar Palpable Palpable  Brachial Palpable Palpable  Gastrointestinal:  Non-distended. No guarding/no peritoneal signs.  Musculoskeletal: M/S 5/5 throughout.  No deformity or atrophy.  Neurologic: CN 2-12 intact. Symmetrical.  Speech is fluent. Motor exam as listed above. Psychiatric: Judgment intact, Mood & affect appropriate for pt's clinical situation. Dermatologic: No rashes or ulcers noted.  No changes consistent with cellulitis.   CBC Lab Results  Component Value Date   WBC 9.4 10/23/2018   HGB 15.4 10/23/2018   HCT 45.7 10/23/2018   MCV 91.4 10/23/2018   PLT 199 10/23/2018    BMET     Component Value Date/Time   NA 141 11/30/2018 1438   K 4.0 11/30/2018 1438   CL 102 11/30/2018 1438   CO2 29 11/30/2018 1438   GLUCOSE 89 11/30/2018 1438   BUN 36 (H) 11/30/2018 1438   CREATININE 2.90 (H) 11/30/2018 1438   CALCIUM 9.7 11/30/2018 1438   GFRNONAA 23 (L) 11/30/2018 1438   GFRAA 27 (L) 11/30/2018 1438   CrCl cannot be calculated (Patient's most recent lab result is older than the maximum 21 days allowed.).  COAG No results found for: INR, PROTIME  Radiology No results found.   Assessment/Plan 1. End stage renal disease ([place]) Recommend:  At this time the patient does not have appropriate extremity access for dialysis  Patient should have a left brachial cephalic fistula created.  The risks, benefits and alternative therapies were reviewed in detail with the patient.  All questions were answered.  The patient agrees to proceed with surgery.    2. Coronary artery disease of native artery of native heart with stable angina pectoris ([place]) Continue cardiac and antihypertensive medications as already ordered and reviewed, no changes at this time.  Continue statin as ordered and reviewed, no changes at this time  Nitrates PRN for chest pain   3. Essential hypertension Continue antihypertensive medications as already ordered, these medications have been reviewed and there are no changes at this time.     Hortencia Pilar, MD  08/29/2020 3:36 PM

## 2020-08-31 ENCOUNTER — Ambulatory Visit (INDEPENDENT_AMBULATORY_CARE_PROVIDER_SITE_OTHER): Payer: Federal, State, Local not specified - PPO

## 2020-08-31 ENCOUNTER — Ambulatory Visit (INDEPENDENT_AMBULATORY_CARE_PROVIDER_SITE_OTHER): Payer: Federal, State, Local not specified - PPO | Admitting: Vascular Surgery

## 2020-08-31 ENCOUNTER — Other Ambulatory Visit: Payer: Self-pay

## 2020-08-31 VITALS — BP 151/106 | HR 62 | Ht 63.0 in | Wt 223.0 lb

## 2020-08-31 DIAGNOSIS — I10 Essential (primary) hypertension: Secondary | ICD-10-CM | POA: Diagnosis not present

## 2020-08-31 DIAGNOSIS — Z7189 Other specified counseling: Secondary | ICD-10-CM | POA: Insufficient documentation

## 2020-08-31 DIAGNOSIS — N186 End stage renal disease: Secondary | ICD-10-CM

## 2020-08-31 DIAGNOSIS — E781 Pure hyperglyceridemia: Secondary | ICD-10-CM | POA: Insufficient documentation

## 2020-08-31 DIAGNOSIS — I25118 Atherosclerotic heart disease of native coronary artery with other forms of angina pectoris: Secondary | ICD-10-CM

## 2020-08-31 DIAGNOSIS — F4310 Post-traumatic stress disorder, unspecified: Secondary | ICD-10-CM | POA: Insufficient documentation

## 2020-09-01 ENCOUNTER — Other Ambulatory Visit: Payer: Self-pay

## 2020-09-01 ENCOUNTER — Observation Stay
Admission: EM | Admit: 2020-09-01 | Discharge: 2020-09-02 | Disposition: A | Discharge status: Home / Self Care | Payer: Federal, State, Local not specified - PPO | Attending: Emergency Medicine | Admitting: Emergency Medicine

## 2020-09-01 ENCOUNTER — Observation Stay: Payer: Federal, State, Local not specified - PPO

## 2020-09-01 ENCOUNTER — Encounter: Payer: Self-pay | Admitting: Emergency Medicine

## 2020-09-01 DIAGNOSIS — I2510 Atherosclerotic heart disease of native coronary artery without angina pectoris: Secondary | ICD-10-CM | POA: Diagnosis present

## 2020-09-01 DIAGNOSIS — R55 Syncope and collapse: Principal | ICD-10-CM | POA: Diagnosis present

## 2020-09-01 DIAGNOSIS — I10 Essential (primary) hypertension: Secondary | ICD-10-CM | POA: Diagnosis present

## 2020-09-01 DIAGNOSIS — G4730 Sleep apnea, unspecified: Secondary | ICD-10-CM | POA: Diagnosis not present

## 2020-09-01 DIAGNOSIS — I509 Heart failure, unspecified: Secondary | ICD-10-CM | POA: Insufficient documentation

## 2020-09-01 DIAGNOSIS — Z7982 Long term (current) use of aspirin: Secondary | ICD-10-CM | POA: Insufficient documentation

## 2020-09-01 DIAGNOSIS — Z940 Kidney transplant status: Secondary | ICD-10-CM | POA: Diagnosis not present

## 2020-09-01 DIAGNOSIS — N186 End stage renal disease: Secondary | ICD-10-CM | POA: Diagnosis present

## 2020-09-01 DIAGNOSIS — I132 Hypertensive heart and chronic kidney disease with heart failure and with stage 5 chronic kidney disease, or end stage renal disease: Secondary | ICD-10-CM | POA: Insufficient documentation

## 2020-09-01 DIAGNOSIS — Z79899 Other long term (current) drug therapy: Secondary | ICD-10-CM | POA: Insufficient documentation

## 2020-09-01 DIAGNOSIS — R001 Bradycardia, unspecified: Secondary | ICD-10-CM | POA: Diagnosis not present

## 2020-09-01 DIAGNOSIS — Z20822 Contact with and (suspected) exposure to covid-19: Secondary | ICD-10-CM | POA: Insufficient documentation

## 2020-09-01 LAB — CBC WITH DIFFERENTIAL/PLATELET
Abs Immature Granulocytes: 0.07 10*3/uL (ref 0.00–0.07)
Basophils Absolute: 0 10*3/uL (ref 0.0–0.1)
Basophils Relative: 0 %
Eosinophils Absolute: 0 10*3/uL (ref 0.0–0.5)
Eosinophils Relative: 0 %
HCT: 42.3 % (ref 39.0–52.0)
Hemoglobin: 13.8 g/dL (ref 13.0–17.0)
Immature Granulocytes: 1 %
Lymphocytes Relative: 7 %
Lymphs Abs: 0.8 10*3/uL (ref 0.7–4.0)
MCH: 31.1 pg (ref 26.0–34.0)
MCHC: 32.6 g/dL (ref 30.0–36.0)
MCV: 95.3 fL (ref 80.0–100.0)
Monocytes Absolute: 1.2 10*3/uL — ABNORMAL HIGH (ref 0.1–1.0)
Monocytes Relative: 11 %
Neutro Abs: 8.9 10*3/uL — ABNORMAL HIGH (ref 1.7–7.7)
Neutrophils Relative %: 81 %
Platelets: 164 10*3/uL (ref 150–400)
RBC: 4.44 MIL/uL (ref 4.22–5.81)
RDW: 15.1 % (ref 11.5–15.5)
WBC: 11 10*3/uL — ABNORMAL HIGH (ref 4.0–10.5)
nRBC: 0.2 % (ref 0.0–0.2)

## 2020-09-01 LAB — BRAIN NATRIURETIC PEPTIDE: B Natriuretic Peptide: 1441.7 pg/mL — ABNORMAL HIGH (ref 0.0–100.0)

## 2020-09-01 LAB — BASIC METABOLIC PANEL
Anion gap: 13 (ref 5–15)
BUN: 71 mg/dL — ABNORMAL HIGH (ref 6–20)
CO2: 19 mmol/L — ABNORMAL LOW (ref 22–32)
Calcium: 9.4 mg/dL (ref 8.9–10.3)
Chloride: 105 mmol/L (ref 98–111)
Creatinine, Ser: 7.53 mg/dL — ABNORMAL HIGH (ref 0.61–1.24)
GFR, Estimated: 8 mL/min — ABNORMAL LOW (ref >60)
Glucose, Bld: 143 mg/dL — ABNORMAL HIGH (ref 70–99)
Potassium: 4.5 mmol/L (ref 3.5–5.1)
Sodium: 137 mmol/L (ref 135–145)

## 2020-09-01 LAB — TROPONIN I (HIGH SENSITIVITY)
Troponin I (High Sensitivity): 113 ng/L (ref <18)
Troponin I (High Sensitivity): 104 ng/L (ref <18)
Troponin I (High Sensitivity): 111 ng/L (ref <18)

## 2020-09-01 LAB — RESP PANEL BY RT-PCR (FLU A&B, COVID) ARPGX2
Influenza A by PCR: NEGATIVE
Influenza B by PCR: NEGATIVE
SARS Coronavirus 2 by RT PCR: NEGATIVE

## 2020-09-01 LAB — TSH: TSH: 0.736 u[IU]/mL (ref 0.350–4.500)

## 2020-09-01 MED ORDER — LIDOCAINE HCL (PF) 1 % IJ SOLN: 5.0000 mL | INTRAMUSCULAR | Status: DC | PRN

## 2020-09-01 MED ORDER — ONDANSETRON HCL 4 MG PO TABS: 4.0000 mg | ORAL_TABLET | Freq: Four times a day (QID) | ORAL | Status: DC | PRN

## 2020-09-01 MED ORDER — ONDANSETRON HCL 4 MG/2ML IJ SOLN: 4.0000 mg | Freq: Four times a day (QID) | INTRAMUSCULAR | Status: DC | PRN

## 2020-09-01 MED ORDER — HYDRALAZINE HCL 25 MG PO TABS
25.0000 mg | ORAL_TABLET | Freq: Three times a day (TID) | ORAL | Status: DC
  Administered 2020-09-01 – 2020-09-02 (×3): 25 mg via ORAL
  Filled 2020-09-01 (×3): qty 1

## 2020-09-01 MED ORDER — ISOSORBIDE DINITRATE 20 MG PO TABS
20.0000 mg | ORAL_TABLET | Freq: Every day | ORAL | Status: DC
  Filled 2020-09-01: qty 1

## 2020-09-01 MED ORDER — SODIUM CHLORIDE 0.9 % IV SOLN: 100.0000 mL | INTRAVENOUS | Status: DC | PRN

## 2020-09-01 MED ORDER — LIDOCAINE-PRILOCAINE 2.5-2.5 % EX CREA: 1.0000 "application " | TOPICAL_CREAM | CUTANEOUS | Status: DC | PRN

## 2020-09-01 MED ORDER — ASPIRIN EC 81 MG PO TBEC
81.0000 mg | DELAYED_RELEASE_TABLET | Freq: Every day | ORAL | Status: DC
  Administered 2020-09-01 – 2020-09-02 (×2): 81 mg via ORAL
  Filled 2020-09-01 (×2): qty 1

## 2020-09-01 MED ORDER — CHLORHEXIDINE GLUCONATE CLOTH 2 % EX PADS
6.0000 | MEDICATED_PAD | Freq: Every day | CUTANEOUS | Status: DC
  Administered 2020-09-02: 6 via TOPICAL

## 2020-09-01 MED ORDER — SODIUM CHLORIDE 0.9% FLUSH
3.0000 mL | Freq: Two times a day (BID) | INTRAVENOUS | Status: DC
  Administered 2020-09-01 – 2020-09-02 (×3): 3 mL via INTRAVENOUS

## 2020-09-01 MED ORDER — HEPARIN SODIUM (PORCINE) 5000 UNIT/ML IJ SOLN
5000.0000 [IU] | Freq: Three times a day (TID) | INTRAMUSCULAR | Status: DC
  Administered 2020-09-01 – 2020-09-02 (×3): 5000 [IU] via SUBCUTANEOUS
  Filled 2020-09-01 (×3): qty 1

## 2020-09-01 MED ORDER — CLOPIDOGREL BISULFATE 75 MG PO TABS
75.0000 mg | ORAL_TABLET | Freq: Every day | ORAL | Status: DC
  Administered 2020-09-01 – 2020-09-02 (×2): 75 mg via ORAL
  Filled 2020-09-01 (×2): qty 1

## 2020-09-01 MED ORDER — SEVELAMER CARBONATE 800 MG PO TABS
1600.0000 mg | ORAL_TABLET | Freq: Three times a day (TID) | ORAL | Status: DC
  Administered 2020-09-01 – 2020-09-02 (×2): 1600 mg via ORAL
  Filled 2020-09-01 (×3): qty 2

## 2020-09-01 MED ORDER — ACETAMINOPHEN 650 MG RE SUPP: 650.0000 mg | Freq: Four times a day (QID) | RECTAL | Status: DC | PRN

## 2020-09-01 MED ORDER — HEPARIN SODIUM (PORCINE) 1000 UNIT/ML IJ SOLN
3600.0000 [IU] | Freq: Once | INTRAMUSCULAR | Status: AC
  Administered 2020-09-01: 3600 [IU]

## 2020-09-01 MED ORDER — HEPARIN SODIUM (PORCINE) 1000 UNIT/ML IJ SOLN
2000.0000 [IU] | Freq: Once | INTRAMUSCULAR | Status: AC
  Administered 2020-09-01: 2000 [IU] via INTRAVENOUS

## 2020-09-01 MED ORDER — CARVEDILOL 12.5 MG PO TABS
12.5000 mg | ORAL_TABLET | Freq: Two times a day (BID) | ORAL | Status: DC
  Administered 2020-09-01 – 2020-09-02 (×3): 12.5 mg via ORAL
  Filled 2020-09-01 (×2): qty 1
  Filled 2020-09-01: qty 2

## 2020-09-01 MED ORDER — HEPARIN SODIUM (PORCINE) 1000 UNIT/ML DIALYSIS: 1000.0000 [IU] | INTRAMUSCULAR | Status: DC | PRN

## 2020-09-01 MED ORDER — LOSARTAN POTASSIUM 50 MG PO TABS
100.0000 mg | ORAL_TABLET | Freq: Every day | ORAL | Status: DC
  Administered 2020-09-01 – 2020-09-02 (×2): 100 mg via ORAL
  Filled 2020-09-01: qty 2

## 2020-09-01 MED ORDER — PENTAFLUOROPROP-TETRAFLUOROETH EX AERO: 1.0000 "application " | INHALATION_SPRAY | CUTANEOUS | Status: DC | PRN

## 2020-09-01 MED ORDER — ALTEPLASE 2 MG IJ SOLR
2.0000 mg | Freq: Once | INTRAMUSCULAR | Status: AC
  Administered 2020-09-01: 1.8 mg

## 2020-09-01 MED ORDER — ALTEPLASE 2 MG IJ SOLR
2.0000 mg | Freq: Once | INTRAMUSCULAR | Status: AC | PRN
  Administered 2020-09-01: 1.8 mg

## 2020-09-01 MED ORDER — ACETAMINOPHEN 325 MG PO TABS: 650.0000 mg | ORAL_TABLET | Freq: Four times a day (QID) | ORAL | Status: DC | PRN

## 2020-09-01 NOTE — Consult Note (Signed)
CARDIOLOGY CONSULT NOTE               Patient ID: Louis Huynh MRN: [id number] DOB/AGE: 1960-11-29 60 y.o.  Admit date: 09/01/2020 Referring Physician Amy Cox, DO Primary Physician [facility] Primary Cardiologist [facility] Reason for Consultation symptomatic bradycardia, syncope  HPI: 60 year old male referred for evaluation of symptomatic bradycardia, syncope, and atrial flutter. 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 The patient reports that he is currently on the heart transplant list and is pursuing kidney transplant list. The patient reports being in his usual state of health this morning, went to the dialysis clinic, and was called from the waiting room to be weighed prior to starting his dialysis session. Upon standing on the scale, he reports that he began to feel lightheaded and dizzy, and attempted to walk to his chair. He can recall grabbing on to a nearby counter, then recalls regaining consciousness on the ground with the staff gathered around him. He had no preceding nausea, diaphoresis, chest pain, shortness of breath, or palpitations. In the ER, ECG revealed atrial flutter at rate of 110 bpm with nonspecific T wave abnormalities. Admission labs notable for creatinine 7.53, BUN 71, sodium 137, K 4.5, WBC 11.0, high sensitivity troponin 113 and 104. Upon review of telemetry strips, the patient has remained in atrial flutter with rates in the 100s-120s bpm without evidence of bradycardia. Per nursing staff, it appears the patient had presumed bradycardia by pulse-oximeter alone.   At this time, the patient reports feeling fine with no dizziness, lightheadedness, chest pain, shortness of breath, peripheral edema, or palpitations. He does report abdominal distention and mild lower extremity edema, which is common for him  prior to dialysis. Of note, the patient was noted to be in atrial flutter recently by his cardiologist and a Holter monitor was ordered, but the patient states he did not end up wearing one. The patient reports that he had a recent episode of syncope about a month ago with preceding shortness of breath while he was working outdoors in the heat.   The patient underwent right heart catheterization in 10/2019 which revealed low filling pressures with cardiac index 1.6 and PVR of 6.3. Nuclear stress test in 10/2019 revealed a moderate in size, moderate in severity minimally reversible defect involving the apical, apical anterior, apical inferior and mid inferior segments, likely representing artifact but cannot entirely rule out infarct with mild peri-infarct ischemia.    Review of systems complete and found to be negative unless listed above     Past Medical History:  Diagnosis Date   CHF (congestive heart failure) ([facility])    Chronic kidney disease    Hypertension    PTSD (post-traumatic stress disorder)     History reviewed. No pertinent surgical history.  (Not in a hospital admission)  Social History   Socioeconomic History   Marital status: Married    Spouse name: Not on file   Number of children: Not on file   Years of education: Not on file   Highest education level: Not on file  Occupational History   Not on file  Tobacco Use   Smoking status: Never   Smokeless tobacco: Never  Substance and Sexual Activity   Alcohol use: Yes    Comment: occasionally   Drug use: Not Currently   Sexual activity: Yes  Other Topics Concern  Not on file  Social History Narrative   Not on file   Social Determinants of Health   Financial Resource Strain: Not on file  Food Insecurity: Not on file  Transportation Needs: Not on file  Physical Activity: Not on file  Stress: Not on file  Social Connections: Not on file  Intimate Partner Violence: Not on file    Family History  Problem Relation  Age of Onset   Hypertension Mother    Hypertension Sister    Hypertension Brother    Heart attack Brother    Heart failure Brother       Review of systems complete and found to be negative unless listed above      PHYSICAL EXAM  General: Well developed, well nourished, sitting up in bed, in no acute distress HEENT:  Normocephalic and atramatic Neck:  No JVD.  Lungs: Clear bilaterally to auscultation, normal effort of breathing on room air Heart: irregular, rapid, no murmurs, rubs, or gallops appreciated Abdomen: firm, distended Msk:  Normal strength and tone for age. Extremities: No clubbing, cyanosis, with mild bilateral ankle edema.   Neuro: Alert and oriented X 3. Psych:  Good affect, responds appropriately  Labs:   Lab Results  Component Value Date   WBC 11.0 (H) 09/01/2020   HGB 13.8 09/01/2020   HCT 42.3 09/01/2020   MCV 95.3 09/01/2020   PLT 164 09/01/2020    Recent Labs  Lab 09/01/20 0655  NA 137  K 4.5  CL 105  CO2 19*  BUN 71*  CREATININE 7.53*  CALCIUM 9.4  GLUCOSE 143*   No results found for: CKTOTAL, CKMB, CKMBINDEX, TROPONINI No results found for: CHOL No results found for: HDL No results found for: LDLCALC No results found for: TRIG No results found for: CHOLHDL No results found for: LDLDIRECT    Radiology: VAS [place] UPPER EXTREMITY ARTERIAL DUPLEX  Result Date: 08/31/2020  UPPER EXTREMITY DUPLEX STUDY Patient Name:  Louis Huynh [facility]  Date of Exam:   08/31/2020 Medical Rec #: [id number]             Accession #:    [id number] Date of Birth: 06-24-1960             Patient Gender: M Patient Age:   63Y Exam Location:  [facility] Procedure:      VAS [place] UPPER EXTREMITY ARTERIAL DUPLEX Referring Phys: [id number] [place] --------------------------------------------------------------------------------  Indications: Esrd.  Performing Technologist: Concha Norway RVT  Examination Guidelines: A complete evaluation includes B-mode imaging,  spectral Doppler, color Doppler, and power Doppler as needed of all accessible portions of each vessel. Bilateral testing is considered an integral part of a complete examination. Limited examinations for reoccurring indications may be performed as noted.  Right Pre-Dialysis Findings: +-----------------------+----------+--------------------+--------+--------+ Location               PSV (cm/s)Intralum. Diam. (cm)WaveformComments +-----------------------+----------+--------------------+--------+--------+ Brachial Antecub. fossa49        0.51                                 +-----------------------+----------+--------------------+--------+--------+ Radial Art at Wrist    46        0.24                                 +-----------------------+----------+--------------------+--------+--------+ Ulnar Art at Wrist     63  0.22                                 +-----------------------+----------+--------------------+--------+--------+  Left Pre-Dialysis Findings: +-----------------------+----------+--------------------+--------+--------+ Location               PSV (cm/s)Intralum. Diam. (cm)WaveformComments +-----------------------+----------+--------------------+--------+--------+ Brachial Antecub. fossa46        0.48                                 +-----------------------+----------+--------------------+--------+--------+ Radial Art at Wrist    54        0.24                                 +-----------------------+----------+--------------------+--------+--------+ Ulnar Art at Wrist     51        0.21                                 +-----------------------+----------+--------------------+--------+--------+  Summary:  Right: No obstruction visualized in the right upper extremity. Left: No obstruction visualized in the left upper extremity. *See table(s) above for measurements and observations. Electronically signed by Hortencia Pilar MD on 08/31/2020 at 5:14:55 PM.     Final    VAS [place] UPPER EXT VEIN MAPPING (PRE-OP AVF)  Result Date: 08/31/2020 UPPER EXTREMITY VEIN MAPPING Patient Name:  Louis Huynh [facility]  Date of Exam:   08/31/2020 Medical Rec #: [id number]             Accession #:    [id number] Date of Birth: 1961/02/20             Patient Gender: M Patient Age:   40Y Exam Location:  [facility] Procedure:      VAS [place] UPPER EXT VEIN MAPPING (PRE-OP AVF) Referring Phys: [id number] [place] --------------------------------------------------------------------------------  Indications: Pre-access. History: Esrd.  Performing Technologist: Concha Norway RVT  Examination Guidelines: A complete evaluation includes B-mode imaging, spectral Doppler, color Doppler, and power Doppler as needed of all accessible portions of each vessel. Bilateral testing is considered an integral part of a complete examination. Limited examinations for reoccurring indications may be performed as noted. +-----------------+-------------+----------+---------+ Right Cephalic   Diameter (cm)Depth (cm)Findings  +-----------------+-------------+----------+---------+ Prox upper arm       0.44                         +-----------------+-------------+----------+---------+ Mid upper arm        0.26               branching +-----------------+-------------+----------+---------+ Dist upper arm       0.31                         +-----------------+-------------+----------+---------+ Antecubital fossa    0.35                         +-----------------+-------------+----------+---------+ Prox forearm         0.22                         +-----------------+-------------+----------+---------+ Mid forearm          0.28  branching +-----------------+-------------+----------+---------+ Dist forearm         0.21                         +-----------------+-------------+----------+---------+ +-----------------+-------------+----------+--------+ Right Basilic     Diameter (cm)Depth (cm)Findings +-----------------+-------------+----------+--------+ Prox upper arm       0.65                        +-----------------+-------------+----------+--------+ Mid upper arm        0.61                        +-----------------+-------------+----------+--------+ Dist upper arm       0.56                        +-----------------+-------------+----------+--------+ Antecubital fossa    0.36                        +-----------------+-------------+----------+--------+ Prox forearm         0.30                        +-----------------+-------------+----------+--------+ +-----------------+-------------+----------+--------+ Left Cephalic    Diameter (cm)Depth (cm)Findings +-----------------+-------------+----------+--------+ Prox upper arm       0.30                        +-----------------+-------------+----------+--------+ Mid upper arm        0.32                        +-----------------+-------------+----------+--------+ Dist upper arm       0.29                        +-----------------+-------------+----------+--------+ Antecubital fossa    0.30                        +-----------------+-------------+----------+--------+ Prox forearm         0.36                        +-----------------+-------------+----------+--------+ Mid forearm          0.26                        +-----------------+-------------+----------+--------+ Dist forearm         0.22                        +-----------------+-------------+----------+--------+ +-----------------+-------------+----------+--------+ Left Basilic     Diameter (cm)Depth (cm)Findings +-----------------+-------------+----------+--------+ Prox upper arm       0.33                        +-----------------+-------------+----------+--------+ Mid upper arm        0.51                        +-----------------+-------------+----------+--------+ Dist upper arm        0.63                        +-----------------+-------------+----------+--------+ Antecubital fossa    0.44                        +-----------------+-------------+----------+--------+  Prox forearm         0.36                        +-----------------+-------------+----------+--------+ Summary: Right: Normal caliber compressible veins. Left: Normal caliber compressible veins. *See table(s) above for measurements and observations.  Diagnosing physician: Hortencia Pilar MD Electronically signed by Hortencia Pilar MD on 08/31/2020 at 5:14:52 PM.    Final     EKG: atrial flutter, rates 120s  ASSESSMENT AND PLAN:  Syncope, with preceding lightheadedness and dizziness, noted to be in rapid atrial flutter, with no significant electrolyte derangements, high sensitivity troponin mildly elevated, down-trending, patient denies chest pain. This is unlikely secondary to ACS. This is the second episode in about 1 month.  Atrial flutter, recently noted as outpatient, and Holter monitor was ordered, but patient reports he never had it placed. Currently in atrial flutter with RVR. Chads vasc score of 2 (HTN, CHF). Presumed bradycardia, per pulse-oximeter, but upon review of telemetry strips, patient has been tachycardic. 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 GDMT limited due to ESRD. Hypertension, elevated at this time   Recommendations: Resume home carvedilol for rate control and hypertension as there has been no evidence of bradycardia. Defer repeating echocardiogram as this was performed as outpatient last month. Recommend dialysis session while admitted Continue aspirin, hydralazine, losartan Defer restarting isosorbide dinitrate as patient reports significant headaches and it was discontinued as outpatient Would recommend close  follow up with outpatient cardiologist next week with Holter placement and chronic anticoagulation recommendations. Continue to monitor on telemetry overnight; if no recurrent events and patient asymptomatic, consider discharge tomorrow with close outpatient follow up.  Signed: Clabe Seal PA-C 09/01/2020, 10:07 AM    Discussed with Dr. Saralyn Pilar and plan made in collaboration with him.

## 2020-09-01 NOTE — ED Notes (Signed)
Pt given meal tray.

## 2020-09-01 NOTE — Progress Notes (Signed)
[facility]  ROUNDING NOTE   Subjective:   Louis Huynh is a 60 y.o. male with past medical history including hypertension, CHF, and ESRD on dialysis. He states he was at his dialysis [facility] being weighed to start his treatment. He was walking to his chair and became light headed. He is being admitted under observation for syncopal episode.   He states this has not happened to him before. Denies poor appetite, nausea, vomiting, and diarrhea. Denies shortness of breath. States he recently began an increased dose of Carvedilol. Unable to state exactly when it was increased. He denies missing previous dialysis treatment.    Objective:  Vital signs in last 24 hours:  Temp:  [97.5 F (36.4 C)] 97.5 F (36.4 C) (06/24 0640) Pulse Rate:  [68-87] 80 (06/24 1430) Resp:  [16-20] 18 (06/24 1130) BP: (113-147)/(87-130) 116/97 (06/24 1130) SpO2:  [93 %-96 %] 95 % (06/24 1430) Weight:  [108.9 kg] 108.9 kg (06/24 0642)  Weight change:  Filed Weights   09/01/20 0642  Weight: 108.9 kg    Intake/Output: No intake/output data recorded.   Intake/Output this shift:  No intake/output data recorded.  Physical Exam: General: NAD, resting in bed  Head: Normocephalic, atraumatic. Moist oral mucosal membranes  Eyes: Anicteric  Lungs:  Clear to auscultation, normal effort  Heart: Regular rate and rhythm  Abdomen:  Soft, nontender  Extremities:  1+ peripheral edema.  Neurologic: Nonfocal, moving all four extremities  Skin: No lesions  Access: Rt IJ Permcath    Basic Metabolic Panel: Recent Labs  Lab 09/01/20 0655  NA 137  K 4.5  CL 105  CO2 19*  GLUCOSE 143*  BUN 71*  CREATININE 7.53*  CALCIUM 9.4    Liver Function Tests: No results for input(s): AST, ALT, ALKPHOS, BILITOT, PROT, ALBUMIN in the last 168 hours. No results for input(s): LIPASE, AMYLASE in the last 168 hours. No results for input(s): AMMONIA in the last 168 hours.  CBC: Recent Labs  Lab  09/01/20 0655  WBC 11.0*  NEUTROABS 8.9*  HGB 13.8  HCT 42.3  MCV 95.3  PLT 164    Cardiac Enzymes: No results for input(s): CKTOTAL, CKMB, CKMBINDEX, TROPONINI in the last 168 hours.  BNP: Invalid input(s): POCBNP  CBG: No results for input(s): GLUCAP in the last 168 hours.  Microbiology: Results for orders placed or performed during the hospital encounter of 09/01/20  Resp Panel by RT-PCR (Flu A&B, Covid) Nasopharyngeal Swab     Status: None   Collection Time: 09/01/20 12:31 PM   Specimen: Nasopharyngeal Swab; Nasopharyngeal(NP) swabs in vial transport medium  Result Value Ref Range Status   SARS Coronavirus 2 by RT PCR NEGATIVE NEGATIVE Final    Comment: (NOTE) SARS-CoV-2 target nucleic acids are NOT DETECTED.  The SARS-CoV-2 RNA is generally detectable in upper respiratory specimens during the acute phase of infection. The lowest concentration of SARS-CoV-2 viral copies this assay can detect is 138 copies/mL. A negative result does not preclude SARS-Cov-2 infection and should not be used as the sole basis for treatment or other patient management decisions. A negative result may occur with  improper specimen collection/handling, submission of specimen other than nasopharyngeal swab, presence of viral mutation(s) within the areas targeted by this assay, and inadequate number of viral copies(<138 copies/mL). A negative result must be combined with clinical observations, patient history, and epidemiological information. The expected result is Negative.  Fact Sheet for Patients:  EntrepreneurPulse.com.au  Fact Sheet for Healthcare Providers:  IncredibleEmployment.be  This test is no t yet approved or cleared by the [place] and  has been authorized for detection and/or diagnosis of SARS-CoV-2 by FDA under an Emergency Use Authorization (EUA). This EUA will remain  in effect (meaning this test can be used) for the duration  of the COVID-19 declaration under Section 564(b)(1) of the Act, 21 U.S.C.section 360bbb-3(b)(1), unless the authorization is terminated  or revoked sooner.       Influenza A by PCR NEGATIVE NEGATIVE Final   Influenza B by PCR NEGATIVE NEGATIVE Final    Comment: (NOTE) The Xpert Xpress SARS-CoV-2/FLU/RSV plus assay is intended as an aid in the diagnosis of influenza from Nasopharyngeal swab specimens and should not be used as a sole basis for treatment. Nasal washings and aspirates are unacceptable for Xpert Xpress SARS-CoV-2/FLU/RSV testing.  Fact Sheet for Patients: EntrepreneurPulse.com.au  Fact Sheet for Healthcare Providers: IncredibleEmployment.be  This test is not yet approved or cleared by the [place] FDA and has been authorized for detection and/or diagnosis of SARS-CoV-2 by FDA under an Emergency Use Authorization (EUA). This EUA will remain in effect (meaning this test can be used) for the duration of the COVID-19 declaration under Section 564(b)(1) of the Act, 21 U.S.C. section 360bbb-3(b)(1), unless the authorization is terminated or revoked.  Performed at [facility], [address] [place], [place] [address]     Coagulation Studies: No results for input(s): LABPROT, INR in the last 72 hours.  Urinalysis: No results for input(s): COLORURINE, LABSPEC, PHURINE, GLUCOSEU, HGBUR, BILIRUBINUR, KETONESUR, PROTEINUR, UROBILINOGEN, NITRITE, LEUKOCYTESUR in the last 72 hours.  Invalid input(s): APPERANCEUR    Imaging: X-ray chest PA and lateral  Result Date: 09/01/2020 CLINICAL DATA:  Near syncope occurring earlier today prior to dialysis. EXAM: CHEST - 2 VIEW COMPARISON:  10/21/2018 FINDINGS: Cardiac silhouette is mildly enlarged. No mediastinal or hilar masses or evidence of adenopathy. Right anterior chest wall tunneled internal jugular dual lumen central venous catheter has its distal tip projecting in the  right atrium. Catheter new since the prior study. Clear lungs.  No pleural effusion or pneumothorax. Skeletal structures are intact. IMPRESSION: 1. No acute cardiopulmonary disease. Electronically Signed   By: Lajean Manes M.D.   On: 09/01/2020 12:29   VAS [place] UPPER EXTREMITY ARTERIAL DUPLEX  Result Date: 08/31/2020  UPPER EXTREMITY DUPLEX STUDY Patient Name:  Louis Huynh  Date of Exam:   08/31/2020 Medical Rec #: [id number]             Accession #:    [id number] Date of Birth: Apr 07, 1960             Patient Gender: M Patient Age:   41Y Exam Location:  [facility] Procedure:      VAS [place] UPPER EXTREMITY ARTERIAL DUPLEX Referring Phys: [id number] [place] --------------------------------------------------------------------------------  Indications: Esrd.  Performing Technologist: Concha Norway RVT  Examination Guidelines: A complete evaluation includes B-mode imaging, spectral Doppler, color Doppler, and power Doppler as needed of all accessible portions of each vessel. Bilateral testing is considered an integral part of a complete examination. Limited examinations for reoccurring indications may be performed as noted.  Right Pre-Dialysis Findings: +-----------------------+----------+--------------------+--------+--------+ Location               PSV (cm/s)Intralum. Diam. (cm)WaveformComments +-----------------------+----------+--------------------+--------+--------+ Brachial Antecub. fossa49        0.51                                 +-----------------------+----------+--------------------+--------+--------+  Radial Art at Wrist    46        0.24                                 +-----------------------+----------+--------------------+--------+--------+ Ulnar Art at Wrist     63        0.22                                 +-----------------------+----------+--------------------+--------+--------+  Left Pre-Dialysis Findings:  +-----------------------+----------+--------------------+--------+--------+ Location               PSV (cm/s)Intralum. Diam. (cm)WaveformComments +-----------------------+----------+--------------------+--------+--------+ Brachial Antecub. fossa46        0.48                                 +-----------------------+----------+--------------------+--------+--------+ Radial Art at Wrist    54        0.24                                 +-----------------------+----------+--------------------+--------+--------+ Ulnar Art at Wrist     51        0.21                                 +-----------------------+----------+--------------------+--------+--------+  Summary:  Right: No obstruction visualized in the right upper extremity. Left: No obstruction visualized in the left upper extremity. *See table(s) above for measurements and observations. Electronically signed by Hortencia Pilar MD on 08/31/2020 at 5:14:55 PM.    Final    VAS [place] UPPER EXT VEIN MAPPING (PRE-OP AVF)  Result Date: 08/31/2020 UPPER EXTREMITY VEIN MAPPING Patient Name:  Louis Huynh  Date of Exam:   08/31/2020 Medical Rec #: [id number]             Accession #:    [id number] Date of Birth: 18-Jan-1961             Patient Gender: M Patient Age:   89Y Exam Location:  [facility] Procedure:      VAS [place] UPPER EXT VEIN MAPPING (PRE-OP AVF) Referring Phys: [id number] [place] --------------------------------------------------------------------------------  Indications: Pre-access. History: Esrd.  Performing Technologist: Concha Norway RVT  Examination Guidelines: A complete evaluation includes B-mode imaging, spectral Doppler, color Doppler, and power Doppler as needed of all accessible portions of each vessel. Bilateral testing is considered an integral part of a complete examination. Limited examinations for reoccurring indications may be performed as noted. +-----------------+-------------+----------+---------+  Right Cephalic   Diameter (cm)Depth (cm)Findings  +-----------------+-------------+----------+---------+ Prox upper arm       0.44                         +-----------------+-------------+----------+---------+ Mid upper arm        0.26               branching +-----------------+-------------+----------+---------+ Dist upper arm       0.31                         +-----------------+-------------+----------+---------+ Antecubital fossa    0.35                         +-----------------+-------------+----------+---------+  Prox forearm         0.22                         +-----------------+-------------+----------+---------+ Mid forearm          0.28               branching +-----------------+-------------+----------+---------+ Dist forearm         0.21                         +-----------------+-------------+----------+---------+ +-----------------+-------------+----------+--------+ Right Basilic    Diameter (cm)Depth (cm)Findings +-----------------+-------------+----------+--------+ Prox upper arm       0.65                        +-----------------+-------------+----------+--------+ Mid upper arm        0.61                        +-----------------+-------------+----------+--------+ Dist upper arm       0.56                        +-----------------+-------------+----------+--------+ Antecubital fossa    0.36                        +-----------------+-------------+----------+--------+ Prox forearm         0.30                        +-----------------+-------------+----------+--------+ +-----------------+-------------+----------+--------+ Left Cephalic    Diameter (cm)Depth (cm)Findings +-----------------+-------------+----------+--------+ Prox upper arm       0.30                        +-----------------+-------------+----------+--------+ Mid upper arm        0.32                         +-----------------+-------------+----------+--------+ Dist upper arm       0.29                        +-----------------+-------------+----------+--------+ Antecubital fossa    0.30                        +-----------------+-------------+----------+--------+ Prox forearm         0.36                        +-----------------+-------------+----------+--------+ Mid forearm          0.26                        +-----------------+-------------+----------+--------+ Dist forearm         0.22                        +-----------------+-------------+----------+--------+ +-----------------+-------------+----------+--------+ Left Basilic     Diameter (cm)Depth (cm)Findings +-----------------+-------------+----------+--------+ Prox upper arm       0.33                        +-----------------+-------------+----------+--------+ Mid upper arm        0.51                        +-----------------+-------------+----------+--------+  Dist upper arm       0.63                        +-----------------+-------------+----------+--------+ Antecubital fossa    0.44                        +-----------------+-------------+----------+--------+ Prox forearm         0.36                        +-----------------+-------------+----------+--------+ Summary: Right: Normal caliber compressible veins. Left: Normal caliber compressible veins. *See table(s) above for measurements and observations.  Diagnosing physician: Hortencia Pilar MD Electronically signed by Hortencia Pilar MD on 08/31/2020 at 5:14:52 PM.    Final      Medications:     aspirin EC  81 mg Oral Daily   carvedilol  12.5 mg Oral BID   [START ON 09/02/2020] Chlorhexidine Gluconate Cloth  6 each Topical Q0600   clopidogrel  75 mg Oral Daily   heparin  5,000 Units Subcutaneous Q8H   hydrALAZINE  25 mg Oral TID   losartan  100 mg Oral Daily   sevelamer carbonate  1,600 mg Oral TID WC   sodium chloride flush  3 mL  Intravenous Q12H   acetaminophen **OR** acetaminophen, ondansetron **OR** ondansetron (ZOFRAN) IV  Assessment/ Plan:  Louis [facility] is a 60 y.o.  male with past medical history including hypertension, CHF, and ESRD on dialysis. He states he was at his dialysis [facility] being weighed to start his treatment. He was walking to his chair and became light headed. He is being admitted under observation for syncopal episode.   Syncopal episode  -ECHO 10/2018 EF 20-25%  -recent increase in Carvedilol  -Cardiology consulted  2. End stage renal disease on dialysis: MWF  - Will schedule dialysis today  - UF goal 2.5L  - Next treatment on Monday, if remains inpatient  3. Hypertension with chronic kidney disease: Well controlled Home regimen consists of Carvedilol, hydralazine, isosorbide and losartan.  Isosorbide currently held  4. Anemia of chronic kidney disease  Lab Results  Component Value Date   HGB 13.8 09/01/2020  Hgb above goal     LOS: 0 Katara Griner 6/24/20222:45 PM

## 2020-09-01 NOTE — ED Notes (Signed)
Pt updated on POC. Family at bedside

## 2020-09-01 NOTE — ED Triage Notes (Signed)
Pt was at dialysis and stepped on the scale and felt dizzy and light headed. He had to grab the counter. Did not fall or strike his head.

## 2020-09-01 NOTE — ED Provider Notes (Signed)
[facility] Emergency Department Provider Note  ____________________________________________   Event Date/Time   First MD Initiated Contact with Patient 09/01/20 [phone]     (approximate)  I have reviewed the triage vital signs and the nursing notes.   HISTORY  Chief Complaint Near Syncope    HPI Louis Huynh is a 60 y.o. male hypertension, CKD on dialysis, CHF who comes in for syncopal episode.  Patient reports that he was at dialysis and he stepped off the scale and felt dizzy and lightheaded and had to grab the counter.  Contrary to triage note patient states that he does feel like he blacked out given there is a time.  He does not remember.  He states that he was laying down on the ground and the dialysis people told him to stay laying down.  He does not think he struck his head, denies any headaches, confusion and there is no signs of any bumps.  The dialysis people also did not state that he hit his head.  He denies any tongue biting or seizure activity or urinary incontinence.  Patient reports being evaluated for heart transplant as well as kidney transplant but still on the wait list.  Patient states that he did eat breakfast this morning and was in his normal state of health prior.  On review of records patient is followed by cardiology at [facility].  At that time they were going to do a Holter monitor please they thought he could have atrial flutter and had an echocardiogram that showed an EF of less than 15%           Past Medical History:  Diagnosis Date   CHF (congestive heart failure) ([place])    Chronic kidney disease    Hypertension    PTSD (post-traumatic stress disorder)     Patient Active Problem List   Diagnosis Date Noted   Other specified counseling 08/31/2020   Post-traumatic stress disorder, unspecified 08/31/2020   Pure hypertriglyceridemia 08/31/2020   End stage renal disease ([place]) 08/29/2020   CAD (coronary artery disease)  08/29/2020   Elevated PSA 04/18/2020   Other specified diseases of hair and hair follicles [id number]   Sleep apnea, unspecified 02/14/2020   Chronic kidney disease 01/19/2020   Heart failure ([place]) 01/19/2020   Hyponatremia 10/25/2019   AKI (acute kidney injury) ([place]) 10/19/2019   Demand ischemia ([place]) 10/19/2019   Acute CHF (congestive heart failure) ([place]) 10/22/2018   Essential hypertension 08/18/2018   Stage 3 chronic kidney disease due to benign hypertension ([place]) 08/18/2018    History reviewed. No pertinent surgical history.  Prior to Admission medications   Medication Sig Start Date End Date Taking? Authorizing Provider  amitriptyline (ELAVIL) 50 MG tablet Take 50 mg by mouth. Patient not taking: Reported on 08/31/2020    [provider]  aspirin EC 81 MG EC tablet Take 1 tablet (81 mg total) by mouth daily. 10/26/18   Stark Jock Jude, MD  carvedilol (COREG) 12.5 MG tablet Take 1 tablet (12.5 mg total) by mouth 2 (two) times daily. MUST MAKE APPOINTMENT FOR FURTHER REFILLS 05/11/19   Darylene Price A, FNP  hydrALAZINE (APRESOLINE) 25 MG tablet Take 25 mg by mouth 3 (three) times daily.    [provider]  isosorbide-hydrALAZINE (BIDIL) 20-37.5 MG tablet Take 1 tablet by mouth 3 (three) times daily. 12/01/18   Alisa Graff, FNP  losartan (COZAAR) 100 MG tablet Take 50 mg by mouth daily.    [provider]  Multiple Vitamin (MULTIVITAMIN) capsule Take 1 capsule by mouth at bedtime.    [provider]  predniSONE (DELTASONE) 50 MG tablet Take by mouth. 08/30/20 09/04/20  [provider]    Allergies Ace inhibitors  Family History  Problem Relation Age of Onset   Hypertension Mother    Hypertension Sister    Hypertension Brother    Heart attack Brother    Heart failure Brother     Social History Social History   Tobacco Use   Smoking status: Never   Smokeless tobacco: Never  Substance Use Topics   Alcohol use: Yes    Comment:  occasionally   Drug use: Not Currently      Review of Systems Constitutional: No fever/chills Eyes: No visual changes. ENT: No sore throat. Cardiovascular: Denies chest pain.  Positive dizziness, syncope Respiratory: Denies shortness of breath. Gastrointestinal: No abdominal pain.  No nausea, no vomiting.  No diarrhea.  No constipation. Genitourinary: Negative for dysuria. Musculoskeletal: Negative for back pain. Skin: Negative for rash. Neurological: Negative for headaches, focal weakness or numbness. All other ROS negative ____________________________________________   PHYSICAL EXAM:  VITAL SIGNS: ED Triage Vitals  Enc Vitals Group     BP 09/01/20 0644 114/89     Pulse Rate 09/01/20 0640 87     Resp 09/01/20 0640 20     Temp 09/01/20 0640 (!) 97.5 F (36.4 C)     Temp Source 09/01/20 0640 Oral     SpO2 --      Weight 09/01/20 0642 240 lb (108.9 kg)     Height 09/01/20 0642 '5\' 3"'$  (1.6 m)     Head Circumference --      Peak Flow --      Pain Score 09/01/20 0641 0     Pain Loc --      Pain Edu? --      Excl. in [place]? --     Constitutional: Alert and oriented. Well appearing and in no acute distress. Eyes: Conjunctivae are normal. EOMI. Head: Atraumatic. Nose: No congestion/rhinnorhea. Mouth/Throat: Mucous membranes are moist.   Neck: No stridor. Trachea Midline. FROM Cardiovascular: Normal rate, regular rhythm. Grossly normal heart sounds.  Good peripheral circulation.  Permacath on the right chest wall Respiratory: Normal respiratory effort.  No retractions. Lungs CTAB. Gastrointestinal: Soft and nontender. No distention. No abdominal bruits.  Musculoskeletal: No lower extremity tenderness nor edema.  No joint effusions. Neurologic:  Normal speech and language. No gross focal neurologic deficits are appreciated.  Skin:  Skin is warm, dry and intact. No rash noted. Psychiatric: Mood and affect are normal. Speech and behavior are normal. GU: Deferred    ____________________________________________   LABS (all labs ordered are listed, but only abnormal results are displayed)  Labs Reviewed  BASIC METABOLIC PANEL - Abnormal; Notable for the following components:      Result Value   CO2 19 (*)    Glucose, Bld 143 (*)    BUN 71 (*)    Creatinine, Ser 7.53 (*)    GFR, Estimated 8 (*)    All other components within normal limits  CBC WITH DIFFERENTIAL/PLATELET - Abnormal; Notable for the following components:   WBC 11.0 (*)    Neutro Abs 8.9 (*)    Monocytes Absolute 1.2 (*)    All other components within normal limits  TROPONIN I (HIGH SENSITIVITY)   ____________________________________________   ED ECG REPORT I, Vanessa Eden, the attending physician, personally viewed and interpreted this ECG.  EKG  is being read as atrial flutter with a 2-1 block at a rate of 110, no ST elevation, Q wave in versions in V4 through V6, prolonged QTC  Reviewed prior EKG and he has had some similar T wave inversions in V5 V6  Repeat EKG continues to look like atrial flutter with a rate of 97, no ST elevation, similar T wave inversions with prolonged QTC ____________________________________________    PROCEDURES  Procedure(s) performed (including Critical Care):  .1-3 Lead EKG Interpretation  Date/Time: 09/01/2020 9:39 AM Performed by: Vanessa [place], MD Authorized by: Vanessa [place], MD     Interpretation: abnormal     ECG rate:  40-90s   Rhythm: atrial flutter     Ectopy: none     Conduction: normal     ____________________________________________   INITIAL IMPRESSION / ASSESSMENT AND PLAN / ED COURSE  Louis [facility] was evaluated in Emergency Department on 09/01/2020 for the symptoms described in the history of present illness. He was evaluated in the context of the global COVID-19 pandemic, which necessitated consideration that the patient might be at risk for infection with the SARS-CoV-2 virus that causes COVID-19.  Institutional protocols and algorithms that pertain to the evaluation of patients at risk for COVID-19 are in a state of rapid change based on information released by regulatory bodies including the CDC and federal and state organizations. These policies and algorithms were followed during the patient's care in the ED.    Patient is a 60 year old who comes in with syncopal episode in the setting of low EF of less than 15%.  At this time I am concerned that this could be related to his heart and I suspect the patient will need admitted for cardiac monitoring and discussion with cardiology.  His EKG is concerning for potential a flutter 2-1 conduction.  He reports maybe a history of this previously and it looks like cardiology was planning to do a Holter monitor on him but he states that he that has not been done yet.  Labs ordered to evaluate for Electra abnormalities, AKI, anemia.  He did not hit his head does not have any signs of head trauma and denies any confusion or headaches to suggest needing CT scan   Labs are reassuring.  Patient does not require emergent dialysis.   Troponin is elevated but similar to prior.  We will hold off on any heparin given I do not think this was heart attack.  Will discuss with hospital team for admission for syncopal work-up   ____________________________________________   FINAL CLINICAL IMPRESSION(S) / ED DIAGNOSES   Final diagnoses:  Syncope, unspecified syncope type  Chronic heart failure, unspecified heart failure type ([place])  ESRD (end stage renal disease) ([place])      MEDICATIONS GIVEN DURING THIS VISIT:  Medications - No data to display   ED Discharge Orders     None        Note:  This document was prepared using Dragon voice recognition software and may include unintentional dictation errors.    Vanessa [place], MD 09/01/20 [phone]

## 2020-09-01 NOTE — Progress Notes (Signed)
This patient arrived for Hd today, denied pain, baseline atrial flutter with a rate as high as the 120's. Nephrology Np S. Breeze was aware at bedside pre HD. HD was initiated via cvc as per orders but shortly into the HD treatment, the pressures in the lumen became elevated and interventions which included flushing with saline, reversing lines and decreaseing blood flow did not prove effective. Dr. Juleen China was made aware during these issues. Blood was then returned because the catheter would not run without alarms at a BFR of 233m/min. Cathflo was then ordered and placed to dwell to exact volumes of each limb as per Dr KJuleen China Will re assess after 45 min dwell time.

## 2020-09-01 NOTE — ED Provider Notes (Signed)
Emergency Medicine Provider Triage Evaluation Note  Louis Huynh , a 59 y.o. male  was evaluated in triage.  Pt complains of syncope.  Patient had just gotten to dialysis and was getting on the scale to weigh himself before starting treatment when he started to feel lightheaded.  This is happened to him once before and he grabbed onto the rails and went down gently.  Staff said he did not strike his head.  No seizure-like activity.  Back at baseline at this time.  Review of Systems  Positive: Syncope or near syncope Negative: Chest pain, shortness of breath, nausea, vomiting, abdominal pain, focal numbness or weakness.  Physical Exam    Gen:   Awake, no distress   Resp:  Normal effort  MSK:   Moves extremities without difficulty  Other:  No evidence of gross neurological deficits  Medical Decision Making  Medically screening exam initiated at 6:30 AM.  Appropriate orders placed.  Corrinne Eagle Krusemark was informed that the remainder of the evaluation will be completed by another provider, this initial triage assessment does not replace that evaluation, and the importance of remaining in the ED until their evaluation is complete.  I ordered EKG, cardiac monitoring, pulse oximetry, CBC to look for any evidence of anemia, and basic metabolic panel to look for any significant electrolyte abnormality.  No chest pain and patient is in end-stage renal disease patient on hemodialysis, no role for troponin at least in the initial triage setting.   Hinda Kehr, MD 09/01/20 [phone]

## 2020-09-01 NOTE — ED Notes (Signed)
Pt transported to xray 

## 2020-09-01 NOTE — Progress Notes (Addendum)
Cathflo was fully removed from catheter after dwell time and the catheter flow was significantly improved. The treatment was then resumed promptly with a new Ecc set up that was prepared during the dwell time.

## 2020-09-01 NOTE — ED Notes (Signed)
Critical result - troponin 113. MD Jari Pigg made aware

## 2020-09-01 NOTE — Progress Notes (Addendum)
At start of HD, patient stated " I can not take off 2.5L net, because I will cramp. I usually can tolerate 1 to 1.5L net" Dr. Juleen China was made aware and verbal was obtained to decrease UF goal to 1L net. Will monitor

## 2020-09-01 NOTE — Progress Notes (Signed)
Received report from [place] for this patient. Taking over as patient HD RN for remainder of tx. Pt alert, oriented, no c/o, irregular pulse on monitor, CCMD aware.

## 2020-09-01 NOTE — Progress Notes (Signed)
This patient tolerated his HD tx today after UF was adjusted by Dr. Juleen China to 1L net. Post Hd, the patient denied complaints, discharged in stable condition back to care Unit.

## 2020-09-01 NOTE — H&P (Addendum)
History and Physical   Louis Huynh SKA:[id number] DOB: 06/28/1960 DOA: 09/01/2020  [facility]P: [facility] [place]  Outpatient Specialists: Dr. Delana Meyer, vascular Patient coming from: Dialysis center  I have personally briefly reviewed patient's old medical records in [place].  Chief Concern: Syncope  HPI: Louis Huynh is a 60 y.o. male with medical history significant for end-stage renal disease on hemodialysis, hypertension, heart failure reduced ejection fraction, presents to the emergency department for chief concerns of presyncope.  Patient was at his dialysis center.  He was just getting off of the scale and walking towards his dialysis chair.  The last thing he remembered was grabbing onto a table as he felt acutely weak and dizzy and lightheaded.  The next thing He remembered he knew he was on the ground with dialysis staff gathered around him.  He does not remember what happened.  He endorses blacking out.  He does not know if he had head trauma.  However at the dialysis center they said he did not lose consciousness and he did not have any head trauma.  He reports that no one stated that he had any seizure activity or loss control of his bowel or urination.  Prior to presentation he denied feeling abnormal.  He was in his usual state of health.  He had had breakfast prior to presenting to the dialysis center as well.  At bedside he was able to tell me his name, age, but he is in the hospital, and identified his spouse at bedside.  Social history: Lives at home with spouse. He is retired and formerly worked as a Designer, industrial/product. He denies tobacco use, etoh use, recreational drug use.   Vaccination history: he is vaccinated for covid 19, with two doses of Moderna  ROS: Constitutional: no weight change, no fever ENT/Mouth: no sore throat, no rhinorrhea Eyes: no eye pain, no vision changes Cardiovascular: no chest pain, no dyspnea,  no edema, no  palpitations Respiratory: no cough, no sputum, no wheezing Gastrointestinal: no nausea, no vomiting, no diarrhea, no constipation Genitourinary: no urinary incontinence, no dysuria, no hematuria Musculoskeletal: no arthralgias, no myalgias Skin: no skin lesions, no pruritus, Neuro: + weakness, no loss of consciousness, no syncope Psych: no anxiety, no depression, no decrease appetite Heme/Lymph: no bruising, no bleeding  ED Course: Discussed with ED provider, patient requiring hospitalization for syncope/presyncope.  Vitals in the emergency department was remarkable for temperature of 97.5, respiration rate of 20, heart rate of 80, blood pressure 114/87, SPO2 of 96% on room air.  Labs in the emergency department was remarkable for serum sodium 137, potassium 4.5, chloride 105, bicarb 19, BUN 71, serum creatinine of 7.53, nonfasting blood glucose 143, WBC 11, hemoglobin 13.8, platelets 164, EGFR 8, high-sensitivity troponin 113.  Assessment/Plan  Active Problems:   Essential hypertension   End stage renal disease (HCC)   CAD (coronary artery disease)   Sleep apnea, unspecified   Symptomatic bradycardia   Syncope/presyncope Query symptomatic bradycardia - Admit to progressive cardiac, observation, with telemetry - Orthostatic vitals - Recent echo on 5/19 06/2020 showed ejection fraction 15 to 20% - Cardiology consulted for ICD - Patient is currently on the transplant list for cardiac and renal transplant - Cardiology states only during initial ICD placement and then patient will need outpatient follow-up with his cardiologist for that - Cardiology team does recommend to admit to observation to progressive cardiac unit for observation -check TSH, Pro-Cal - Dialysis staff reports he did not lose  consciousness - Portable chest x-ray ordered  End-stage renal disease on hemodialysis-routine nephrology consult via secure chat - Resumed home sevelamer carbonate 1600 3 times  daily  Hypertension-elevated resumed losartan 100 mg daily, hydralazine 3 times daily, carvedilol 12.5 mg twice daily  PAD-resumed Plavix 75 mg daily, aspirin 81 mg daily  Heart failure reduced ejection fraction-carvedilol 12.5 mg twice daily resumed per cardiology  Chart reviewed.  Echo on 07/27/2020: EF of 15 to 20%.  Severe left ventricular dysfunction, with mild LVH, elevated LA pressures with diastolic dysfunction.  Severe RV systolic dysfunction.  Mild MR, trivial PR, moderate TR, no valvular stenosis.  DVT prophylaxis: Heparin 5000 units subcutaneous every 8 hours, TED hose Code Status: Full code Diet: Renal Family Communication: Updated spouse at bedside Disposition Plan: Pending clinical course Consults called: Cardiology Admission status: Progressive cardiac, observation, telemetry  Past Medical History:  Diagnosis Date   CHF (congestive heart failure) ([place])    Chronic kidney disease    Hypertension    PTSD (post-traumatic stress disorder)    History reviewed. No pertinent surgical history.  Social History:  reports that he has never smoked. He has never used smokeless tobacco. He reports current alcohol use. He reports previous drug use.  Allergies  Allergen Reactions   Ace Inhibitors Hives   Family History  Problem Relation Age of Onset   Hypertension Mother    Hypertension Sister    Hypertension Brother    Heart attack Brother    Heart failure Brother    Family history: Family history reviewed and not pertinent  Prior to Admission medications   Medication Sig Start Date End Date Taking? Authorizing Provider  aspirin EC 81 MG EC tablet Take 1 tablet (81 mg total) by mouth daily. 10/26/18  Yes Ojie, Jude, MD  carvedilol (COREG) 25 MG tablet Take 25 mg by mouth 2 (two) times daily.   Yes [provider]  clopidogrel (PLAVIX) 75 MG tablet Take 75 mg by mouth daily.   Yes [provider]  hydrALAZINE (APRESOLINE) 50 MG tablet Take 50 mg by mouth  3 (three) times daily.   Yes [provider]  isosorbide dinitrate (ISORDIL) 20 MG tablet Take 20 mg by mouth daily.   Yes [provider]  losartan (COZAAR) 100 MG tablet Take 100 mg by mouth daily.   Yes [provider]  Multiple Vitamin (MULTIVITAMIN) capsule Take 1 capsule by mouth at bedtime.   Yes [provider]  predniSONE (DELTASONE) 50 MG tablet Take 50 mg by mouth daily. 08/30/20 09/04/20 Yes [provider]  sevelamer carbonate (RENVELA) 800 MG tablet Take 1,600 mg by mouth 3 (three) times daily with meals.   Yes [provider]   Physical Exam: Vitals:   09/01/20 0700 09/01/20 0830 09/01/20 0930 09/01/20 1030  BP: 114/87 (!) 113/94 (!) 116/93 (!) 147/130  Pulse: 80 73    Resp:   18   Temp:      TempSrc:      SpO2: 96% 95% 94% 96%  Weight:      Height:       Constitutional: appears age-appropriate, NAD, calm, comfortable Eyes: PERRL, lids and conjunctivae normal ENMT: Mucous membranes are moist. Posterior pharynx clear of any exudate or lesions. Age-appropriate dentition. Hearing appropriate Neck: normal, supple, no masses, no thyromegaly Respiratory: clear to auscultation bilaterally, no wheezing, no crackles. Normal respiratory effort. No accessory muscle use.  Cardiovascular: Regular rate and rhythm, no murmurs / rubs / gallops. No extremity edema. 2+  pedal pulses. No carotid bruits.  Abdomen: Obese abdomen, no tenderness, no masses palpated, no hepatosplenomegaly. Bowel sounds positive.  Musculoskeletal: no clubbing / cyanosis. No joint deformity upper and lower extremities. Good ROM, no contractures, no atrophy. Normal muscle tone.  Skin: no rashes, lesions, ulcers. No induration Neurologic: Sensation intact. Strength 5/5 in all 4.  Psychiatric: Normal judgment and insight. Alert and oriented x 3. Normal mood.   EKG: independently reviewed, showing atrial flutter with rate of 97, QTc 506  Chest x-ray on Admission: I  personally reviewed and I agree with radiologist reading as below.  VAS [place] UPPER EXTREMITY ARTERIAL DUPLEX  Result Date: 08/31/2020  UPPER EXTREMITY DUPLEX STUDY Patient Name:  Thos DEVONNE [facility]  Date of Exam:   08/31/2020 Medical Rec #: [id number]             Accession #:    [id number] Date of Birth: 1960-10-25             Patient Gender: M Patient Age:   57Y Exam Location:  [facility] Procedure:      VAS [place] UPPER EXTREMITY ARTERIAL DUPLEX Referring Phys: [id number] [place] --------------------------------------------------------------------------------  Indications: Esrd.  Performing Technologist: Concha Norway RVT  Examination Guidelines: A complete evaluation includes B-mode imaging, spectral Doppler, color Doppler, and power Doppler as needed of all accessible portions of each vessel. Bilateral testing is considered an integral part of a complete examination. Limited examinations for reoccurring indications may be performed as noted.  Right Pre-Dialysis Findings: +-----------------------+----------+--------------------+--------+--------+ Location               PSV (cm/s)Intralum. Diam. (cm)WaveformComments +-----------------------+----------+--------------------+--------+--------+ Brachial Antecub. fossa49        0.51                                 +-----------------------+----------+--------------------+--------+--------+ Radial Art at Wrist    46        0.24                                 +-----------------------+----------+--------------------+--------+--------+ Ulnar Art at Wrist     63        0.22                                 +-----------------------+----------+--------------------+--------+--------+  Left Pre-Dialysis Findings: +-----------------------+----------+--------------------+--------+--------+ Location               PSV (cm/s)Intralum. Diam. (cm)WaveformComments +-----------------------+----------+--------------------+--------+--------+  Brachial Antecub. fossa46        0.48                                 +-----------------------+----------+--------------------+--------+--------+ Radial Art at Wrist    54        0.24                                 +-----------------------+----------+--------------------+--------+--------+ Ulnar Art at Wrist     51        0.21                                 +-----------------------+----------+--------------------+--------+--------+  Summary:  Right: No obstruction  visualized in the right upper extremity. Left: No obstruction visualized in the left upper extremity. *See table(s) above for measurements and observations. Electronically signed by Hortencia Pilar MD on 08/31/2020 at 5:14:55 PM.    Final    VAS [place] UPPER EXT VEIN MAPPING (PRE-OP AVF)  Result Date: 08/31/2020 UPPER EXTREMITY VEIN MAPPING Patient Name:  Brandon DEVONNE [facility]  Date of Exam:   08/31/2020 Medical Rec #: [id number]             Accession #:    [id number] Date of Birth: 05/08/1960             Patient Gender: M Patient Age:   63Y Exam Location:  [facility] Procedure:      VAS [place] UPPER EXT VEIN MAPPING (PRE-OP AVF) Referring Phys: [id number] [place] --------------------------------------------------------------------------------  Indications: Pre-access. History: Esrd.  Performing Technologist: Concha Norway RVT  Examination Guidelines: A complete evaluation includes B-mode imaging, spectral Doppler, color Doppler, and power Doppler as needed of all accessible portions of each vessel. Bilateral testing is considered an integral part of a complete examination. Limited examinations for reoccurring indications may be performed as noted. +-----------------+-------------+----------+---------+ Right Cephalic   Diameter (cm)Depth (cm)Findings  +-----------------+-------------+----------+---------+ Prox upper arm       0.44                         +-----------------+-------------+----------+---------+ Mid  upper arm        0.26               branching +-----------------+-------------+----------+---------+ Dist upper arm       0.31                         +-----------------+-------------+----------+---------+ Antecubital fossa    0.35                         +-----------------+-------------+----------+---------+ Prox forearm         0.22                         +-----------------+-------------+----------+---------+ Mid forearm          0.28               branching +-----------------+-------------+----------+---------+ Dist forearm         0.21                         +-----------------+-------------+----------+---------+ +-----------------+-------------+----------+--------+ Right Basilic    Diameter (cm)Depth (cm)Findings +-----------------+-------------+----------+--------+ Prox upper arm       0.65                        +-----------------+-------------+----------+--------+ Mid upper arm        0.61                        +-----------------+-------------+----------+--------+ Dist upper arm       0.56                        +-----------------+-------------+----------+--------+ Antecubital fossa    0.36                        +-----------------+-------------+----------+--------+ Prox forearm         0.30                        +-----------------+-------------+----------+--------+ +-----------------+-------------+----------+--------+  Left Cephalic    Diameter (cm)Depth (cm)Findings +-----------------+-------------+----------+--------+ Prox upper arm       0.30                        +-----------------+-------------+----------+--------+ Mid upper arm        0.32                        +-----------------+-------------+----------+--------+ Dist upper arm       0.29                        +-----------------+-------------+----------+--------+ Antecubital fossa    0.30                         +-----------------+-------------+----------+--------+ Prox forearm         0.36                        +-----------------+-------------+----------+--------+ Mid forearm          0.26                        +-----------------+-------------+----------+--------+ Dist forearm         0.22                        +-----------------+-------------+----------+--------+ +-----------------+-------------+----------+--------+ Left Basilic     Diameter (cm)Depth (cm)Findings +-----------------+-------------+----------+--------+ Prox upper arm       0.33                        +-----------------+-------------+----------+--------+ Mid upper arm        0.51                        +-----------------+-------------+----------+--------+ Dist upper arm       0.63                        +-----------------+-------------+----------+--------+ Antecubital fossa    0.44                        +-----------------+-------------+----------+--------+ Prox forearm         0.36                        +-----------------+-------------+----------+--------+ Summary: Right: Normal caliber compressible veins. Left: Normal caliber compressible veins. *See table(s) above for measurements and observations.  Diagnosing physician: Hortencia Pilar MD Electronically signed by Hortencia Pilar MD on 08/31/2020 at 5:14:52 PM.    Final     Labs on Admission: I have personally reviewed following labs  CBC: Recent Labs  Lab 09/01/20 0655  WBC 11.0*  NEUTROABS 8.9*  HGB 13.8  HCT 42.3  MCV 95.3  PLT 161   Basic Metabolic Panel: Recent Labs  Lab 09/01/20 0655  NA 137  K 4.5  CL 105  CO2 19*  GLUCOSE 143*  BUN 71*  CREATININE 7.53*  CALCIUM 9.4   GFR: Estimated Creatinine Clearance: 11.8 mL/min (A) (by C-G formula based on SCr of 7.53 mg/dL (H)).  Dr. Tobie Poet [facility]  If 7PM-7AM, please contact overnight-coverage provider If 7AM-7PM, please contact day coverage  provider [email or web address]  09/01/2020, 11:50 AM

## 2020-09-02 ENCOUNTER — Encounter: Payer: Self-pay | Admitting: Internal Medicine

## 2020-09-02 DIAGNOSIS — N186 End stage renal disease: Secondary | ICD-10-CM | POA: Diagnosis not present

## 2020-09-02 DIAGNOSIS — R55 Syncope and collapse: Secondary | ICD-10-CM | POA: Diagnosis not present

## 2020-09-02 LAB — BASIC METABOLIC PANEL
Anion gap: 10 (ref 5–15)
BUN: 43 mg/dL — ABNORMAL HIGH (ref 6–20)
CO2: 29 mmol/L (ref 22–32)
Calcium: 9.4 mg/dL (ref 8.9–10.3)
Chloride: 99 mmol/L (ref 98–111)
Creatinine, Ser: 5.7 mg/dL — ABNORMAL HIGH (ref 0.61–1.24)
GFR, Estimated: 11 mL/min — ABNORMAL LOW (ref >60)
Glucose, Bld: 103 mg/dL — ABNORMAL HIGH (ref 70–99)
Potassium: 4.6 mmol/L (ref 3.5–5.1)
Sodium: 138 mmol/L (ref 135–145)

## 2020-09-02 LAB — CBC
HCT: 43.1 % (ref 39.0–52.0)
Hemoglobin: 14.2 g/dL (ref 13.0–17.0)
MCH: 31.1 pg (ref 26.0–34.0)
MCHC: 32.9 g/dL (ref 30.0–36.0)
MCV: 94.3 fL (ref 80.0–100.0)
Platelets: 149 10*3/uL — ABNORMAL LOW (ref 150–400)
RBC: 4.57 MIL/uL (ref 4.22–5.81)
RDW: 15 % (ref 11.5–15.5)
WBC: 11.2 10*3/uL — ABNORMAL HIGH (ref 4.0–10.5)
nRBC: 0 % (ref 0.0–0.2)

## 2020-09-02 LAB — GLUCOSE, CAPILLARY: Glucose-Capillary: 111 mg/dL — ABNORMAL HIGH (ref 70–99)

## 2020-09-02 LAB — HIV ANTIBODY (ROUTINE TESTING W REFLEX): HIV Screen 4th Generation wRfx: NONREACTIVE

## 2020-09-02 LAB — HEPATITIS B SURFACE ANTIGEN: Hepatitis B Surface Ag: NONREACTIVE

## 2020-09-02 LAB — PROCALCITONIN: Procalcitonin: 0.53 ng/mL

## 2020-09-02 MED ORDER — CARVEDILOL 25 MG PO TABS: 12.5000 mg | ORAL_TABLET | Freq: Two times a day (BID) | ORAL | Status: AC

## 2020-09-02 NOTE — Plan of Care (Signed)
Pt admitted from ED 6/24 and transported to dialysis before to floor. Pt oriented to room and floor. POC reviewed with pt. Pt VSS, NAD or c/o pain verbalized. Pt given sandwich tray and medications as ordered. Pt A&Ox4. For more details see flowsheets.  Problem: Education: Goal: Knowledge of General Education information will improve Description: Including pain rating scale, medication(s)/side effects and non-pharmacologic comfort measures Outcome: Progressing   Problem: Health Behavior/Discharge Planning: Goal: Ability to manage health-related needs will improve Outcome: Progressing   Problem: Clinical Measurements: Goal: Ability to maintain clinical measurements within normal limits will improve Outcome: Progressing Goal: Will remain free from infection Outcome: Progressing Goal: Diagnostic test results will improve Outcome: Progressing Goal: Respiratory complications will improve Outcome: Progressing Goal: Cardiovascular complication will be avoided Outcome: Progressing   Problem: Activity: Goal: Risk for activity intolerance will decrease Outcome: Progressing   Problem: Nutrition: Goal: Adequate nutrition will be maintained Outcome: Progressing   Problem: Coping: Goal: Level of anxiety will decrease Outcome: Progressing   Problem: Elimination: Goal: Will not experience complications related to bowel motility Outcome: Progressing Goal: Will not experience complications related to urinary retention Outcome: Progressing   Problem: Pain Managment: Goal: General experience of comfort will improve Outcome: Progressing   Problem: Safety: Goal: Ability to remain free from injury will improve Outcome: Progressing   Problem: Skin Integrity: Goal: Risk for impaired skin integrity will decrease Outcome: Progressing

## 2020-09-02 NOTE — Progress Notes (Signed)
[facility]  SUBJECTIVE: Patient laying in bed, fully dressed, reports feeling well, wishes to go home   Vitals:   09/01/20 2204 09/02/20 0448 09/02/20 0808 09/02/20 0809  BP: (!) 125/99 (!) 110/96 (!) 134/104 (!) 134/104  Pulse: 81 60 82 82  Resp: '18 18  16  '$ Temp: 98.1 F (36.7 C) (!) 97.5 F (36.4 C)  (!) 97.5 F (36.4 C)  TempSrc: Oral Oral    SpO2: 98% 97%  95%  Weight: 102.9 kg 101.2 kg    Height: '5\' 8"'$  (1.727 m)        Intake/Output Summary (Last 24 hours) at 09/02/2020 0931 Last data filed at 09/02/2020 [id number] Gross per 24 hour  Intake 243 ml  Output 1000 ml  Net -757 ml      PHYSICAL EXAM  General: Well developed, well nourished, in no acute distress HEENT:  Normocephalic and atramatic Neck:  No JVD.  Lungs: Clear bilaterally to auscultation and percussion. Heart: HRRR . Normal S1 and S2 without gallops or murmurs.  Abdomen: Bowel sounds are positive, abdomen soft and non-tender  Msk:  Back normal, normal gait. Normal strength and tone for age. Extremities: No clubbing, cyanosis or edema.   Neuro: Alert and oriented X 3. Psych:  Good affect, responds appropriately   LABS: Basic Metabolic Panel: Recent Labs    09/01/20 0655 09/02/20 0439  NA 137 138  K 4.5 4.6  CL 105 99  CO2 19* 29  GLUCOSE 143* 103*  BUN 71* 43*  CREATININE 7.53* 5.70*  CALCIUM 9.4 9.4   Liver Function Tests: No results for input(s): AST, ALT, ALKPHOS, BILITOT, PROT, ALBUMIN in the last 72 hours. No results for input(s): LIPASE, AMYLASE in the last 72 hours. CBC: Recent Labs    09/01/20 0655 09/02/20 0439  WBC 11.0* 11.2*  NEUTROABS 8.9*  --   HGB 13.8 14.2  HCT 42.3 43.1  MCV 95.3 94.3  PLT 164 149*   Cardiac Enzymes: No results for input(s): CKTOTAL, CKMB, CKMBINDEX, TROPONINI in the last 72 hours. BNP: Invalid input(s): POCBNP D-Dimer: No results for input(s): DDIMER in the last 72 hours. Hemoglobin A1C: No results for input(s): HGBA1C in the last 72  hours. Fasting Lipid Panel: No results for input(s): CHOL, HDL, LDLCALC, TRIG, CHOLHDL, LDLDIRECT in the last 72 hours. Thyroid Function Tests: Recent Labs    09/01/20 1231  TSH 0.736   Anemia Panel: No results for input(s): VITAMINB12, FOLATE, FERRITIN, TIBC, IRON, RETICCTPCT in the last 72 hours.  X-ray chest PA and lateral  Result Date: 09/01/2020 CLINICAL DATA:  Near syncope occurring earlier today prior to dialysis. EXAM: CHEST - 2 VIEW COMPARISON:  10/21/2018 FINDINGS: Cardiac silhouette is mildly enlarged. No mediastinal or hilar masses or evidence of adenopathy. Right anterior chest wall tunneled internal jugular dual lumen central venous catheter has its distal tip projecting in the right atrium. Catheter new since the prior study. Clear lungs.  No pleural effusion or pneumothorax. Skeletal structures are intact. IMPRESSION: 1. No acute cardiopulmonary disease. Electronically Signed   By: Lajean Manes M.D.   On: 09/01/2020 12:29     Echo LVEF 20% by 2D echocardiogram 07/27/2020  TELEMETRY: Atrial flutter 83 bpm:  ASSESSMENT AND PLAN:  Active Problems:   Essential hypertension   End stage renal disease (HCC)   CAD (coronary artery disease)   Sleep apnea, unspecified   Symptomatic bradycardia   Syncope    Syncope, with preceding lightheadedness and dizziness, noted to be in rapid atrial  flutter, with no significant electrolyte derangements, high sensitivity troponin mildly elevated, down-trending, patient denies chest pain. This is unlikely secondary to ACS. This is the second episode in about 1 month. Atrial flutter, recently noted as outpatient, and Holter monitor was ordered, but patient reports he never had it placed. Currently in atrial flutter at 83 bpm.  Chads vasc score of 2 (HTN, CHF). Presumed bradycardia, per pulse-oximeter, but upon review of telemetry strips, patient has been tachycardic. 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 GDMT limited due to ESRD. Hypertension, blood pressure adequately controlled     Recommendations:  1.  Continue current medications 2.  Continue dual antiplatelet therapy 3.  Continue carvedilol, hydralazine, and losartan for blood pressure control 4.  Defer further cardiac diagnostics at this time 5.  May discharge home, follow-up with [facility] cardiology, at which time Holter monitor may be placed, and further consideration for chronic anticoagulation     Louis Cowman, MD, PhD, [facility] 09/02/2020 9:31 AM

## 2020-09-02 NOTE — Discharge Summary (Addendum)
Physician Discharge Summary  Louis Huynh [id number] DOB: [birth year] DOA: 09/01/2020  [facility]P: [facility]  Admit date: 09/01/2020 Discharge date: 09/02/2020  Discharge disposition: Home   Recommendations for Outpatient Follow-Up:   Follow-up with cardiologist at [facility] in 1 to 2 weeks. Continue outpatient hemodialysis as scheduled on Mondays, Wednesdays and Fridays   Discharge Diagnosis:   Principal Problem:   Near syncope Active Problems:   Essential hypertension   End stage renal disease (HCC)   CAD (coronary artery disease)   Sleep apnea, unspecified   Symptomatic bradycardia    Discharge Condition: Stable.  Diet recommendation:  Diet Order             Diet renal 60/70-04-12-1198                     Code Status: Prior     Hospital Course:   [name] is a 60 year old with medical history significant for ESRD on hemodialysis (Mondays, Wednesdays and Fridays) hypertension, chronic systolic CHF (2D echo in May 2022 showed EF estimated at 20%), being evaluated for kidney and heart transplant, who presented to the hospital with near syncope at the outpatient hemodialysis center.  He had just weighed himself on the standing scale and as he was moving from the scale to his chair, he felt as though he was about to pass out.  He was then surrounded by staff at the hemodialysis center.  He was brought to the hospital for further evaluation.  He was admitted to the hospital for observation.  He did not have any syncopal episode in the hospital.  Exact etiology of near syncope was not clear.  He was evaluated by the cardiologist.  He was previously taking carvedilol 25 mg twice daily which was started about 2 months prior to admission.  He was recommended that carvedilol be decreased to 12.5 mg twice daily.  Patient thinks that his blood pressure may have been relatively low at the hemodialysis center.  From cardiology standpoint, patient is okay  for discharge to home today.  Case was discussed with Dr. Saralyn Pilar, cardiologist, via secure chat.   Medical Consultants:   Cardiologist Nephrologist   Discharge Exam:    Vitals:   09/01/20 2204 09/02/20 0448 09/02/20 0808 09/02/20 0809  BP: (!) 125/99 (!) 110/96 (!) 134/104 (!) 134/104  Pulse: 81 60 82 82  Resp: '18 18  16  '$ Temp: 98.1 F (36.7 C) (!) 97.5 F (36.4 C)  (!) 97.5 F (36.4 C)  TempSrc: Oral Oral    SpO2: 98% 97%  95%  Weight: 102.9 kg 101.2 kg    Height: '5\' 8"'$  (1.727 m)        GEN: NAD SKIN: Warm and dry EYES: No pallor or icterus ENT: MMM CV: RRR PULM: CTA B ABD: soft, ND, NT, +BS CNS: AAO x 3, non focal EXT: Bilateral leg pitting edema (left greater than right), no tenderness or erythema   The results of significant diagnostics from this hospitalization (including imaging, microbiology, ancillary and laboratory) are listed below for reference.     Procedures and Diagnostic Studies:   X-ray chest PA and lateral  Result Date: 09/01/2020 CLINICAL DATA:  Near syncope occurring earlier today prior to dialysis. EXAM: CHEST - 2 VIEW COMPARISON:  10/21/2018 FINDINGS: Cardiac silhouette is mildly enlarged. No mediastinal or hilar masses or evidence of adenopathy. Right anterior chest wall tunneled internal jugular dual lumen central venous catheter has its distal tip projecting in  the right atrium. Catheter new since the prior study. Clear lungs.  No pleural effusion or pneumothorax. Skeletal structures are intact. IMPRESSION: 1. No acute cardiopulmonary disease. Electronically Signed   By: Lajean Manes M.D.   On: 09/01/2020 12:29     Labs:   Basic Metabolic Panel: Recent Labs  Lab 09/01/20 0655 09/02/20 0439  NA 137 138  K 4.5 4.6  CL 105 99  CO2 19* 29  GLUCOSE 143* 103*  BUN 71* 43*  CREATININE 7.53* 5.70*  CALCIUM 9.4 9.4   GFR Estimated Creatinine Clearance: 16.3 mL/min (A) (by C-G formula based on SCr of 5.7 mg/dL (H)). Liver Function  Tests: No results for input(s): AST, ALT, ALKPHOS, BILITOT, PROT, ALBUMIN in the last 168 hours. No results for input(s): LIPASE, AMYLASE in the last 168 hours. No results for input(s): AMMONIA in the last 168 hours. Coagulation profile No results for input(s): INR, PROTIME in the last 168 hours.  CBC: Recent Labs  Lab 09/01/20 0655 09/02/20 0439  WBC 11.0* 11.2*  NEUTROABS 8.9*  --   HGB 13.8 14.2  HCT 42.3 43.1  MCV 95.3 94.3  PLT 164 149*   Cardiac Enzymes: No results for input(s): CKTOTAL, CKMB, CKMBINDEX, TROPONINI in the last 168 hours. BNP: Invalid input(s): POCBNP CBG: Recent Labs  Lab 09/02/20 0451  GLUCAP 111*   D-Dimer No results for input(s): DDIMER in the last 72 hours. Hgb A1c No results for input(s): HGBA1C in the last 72 hours. Lipid Profile No results for input(s): CHOL, HDL, LDLCALC, TRIG, CHOLHDL, LDLDIRECT in the last 72 hours. Thyroid function studies Recent Labs    09/01/20 1231  TSH 0.736   Anemia work up No results for input(s): VITAMINB12, FOLATE, FERRITIN, TIBC, IRON, RETICCTPCT in the last 72 hours. Microbiology Recent Results (from the past 240 hour(s))  Resp Panel by RT-PCR (Flu A&B, Covid) Nasopharyngeal Swab     Status: None   Collection Time: 09/01/20 12:31 PM   Specimen: Nasopharyngeal Swab; Nasopharyngeal(NP) swabs in vial transport medium  Result Value Ref Range Status   SARS Coronavirus 2 by RT PCR NEGATIVE NEGATIVE Final    Comment: (NOTE) SARS-CoV-2 target nucleic acids are NOT DETECTED.  The SARS-CoV-2 RNA is generally detectable in upper respiratory specimens during the acute phase of infection. The lowest concentration of SARS-CoV-2 viral copies this assay can detect is 138 copies/mL. A negative result does not preclude SARS-Cov-2 infection and should not be used as the sole basis for treatment or other patient management decisions. A negative result may occur with  improper specimen collection/handling, submission of  specimen other than nasopharyngeal swab, presence of viral mutation(s) within the areas targeted by this assay, and inadequate number of viral copies(<138 copies/mL). A negative result must be combined with clinical observations, patient history, and epidemiological information. The expected result is Negative.  Fact Sheet for Patients:  EntrepreneurPulse.com.au  Fact Sheet for Healthcare Providers:  IncredibleEmployment.be  This test is no t yet approved or cleared by the [place] FDA and  has been authorized for detection and/or diagnosis of SARS-CoV-2 by FDA under an Emergency Use Authorization (EUA). This EUA will remain  in effect (meaning this test can be used) for the duration of the COVID-19 declaration under Section 564(b)(1) of the Act, 21 U.S.C.section 360bbb-3(b)(1), unless the authorization is terminated  or revoked sooner.       Influenza A by PCR NEGATIVE NEGATIVE Final   Influenza B by PCR NEGATIVE NEGATIVE Final    Comment: (NOTE)  The Xpert Xpress SARS-CoV-2/FLU/RSV plus assay is intended as an aid in the diagnosis of influenza from Nasopharyngeal swab specimens and should not be used as a sole basis for treatment. Nasal washings and aspirates are unacceptable for Xpert Xpress SARS-CoV-2/FLU/RSV testing.  Fact Sheet for Patients: EntrepreneurPulse.com.au  Fact Sheet for Healthcare Providers: IncredibleEmployment.be  This test is not yet approved or cleared by the [place] FDA and has been authorized for detection and/or diagnosis of SARS-CoV-2 by FDA under an Emergency Use Authorization (EUA). This EUA will remain in effect (meaning this test can be used) for the duration of the COVID-19 declaration under Section 564(b)(1) of the Act, 21 U.S.C. section 360bbb-3(b)(1), unless the authorization is terminated or revoked.  Performed at [facility], [address] [place], [place] [address]      Discharge Instructions:   Discharge Instructions     Diet renal 60/70-04-12-1198   Complete by: As directed    Discharge instructions   Complete by: As directed    Follow up with cardiologist at [facility] in 1 to 2 weeks. Continue outpatient hemodialysis as scheduled on Mondays, Wednesdays and Fridays. Follow up with PCP at the [facility] clinic as scheduled on 09/07/2020   Increase activity slowly   Complete by: As directed       Allergies as of 09/02/2020       Reactions   Ace Inhibitors Hives        Medication List     TAKE these medications    aspirin 81 MG EC tablet Take 1 tablet (81 mg total) by mouth daily.   carvedilol 25 MG tablet Commonly known as: COREG Take 0.5 tablets (12.5 mg total) by mouth 2 (two) times daily. What changed: how much to take   clopidogrel 75 MG tablet Commonly known as: PLAVIX Take 75 mg by mouth daily.   hydrALAZINE 50 MG tablet Commonly known as: APRESOLINE Take 50 mg by mouth 3 (three) times daily.   isosorbide dinitrate 20 MG tablet Commonly known as: ISORDIL Take 20 mg by mouth daily.   losartan 100 MG tablet Commonly known as: COZAAR Take 100 mg by mouth daily.   multivitamin capsule Take 1 capsule by mouth at bedtime.   predniSONE 50 MG tablet Commonly known as: DELTASONE Take 50 mg by mouth daily.   sevelamer carbonate 800 MG tablet Commonly known as: RENVELA Take 1,600 mg by mouth 3 (three) times daily with meals.          Time coordinating discharge: 28 minutes  Signed:  Ara Grandmaison  [facility] 09/02/2020, 5:15 PM   Pager on [email or web address]. If 7PM-7AM, please contact night-coverage at [email or web address]

## 2020-09-03 LAB — HEPATITIS B SURFACE ANTIBODY, QUANTITATIVE: Hep B S AB Quant (Post): 79.4 m[IU]/mL (ref >9.9)

## 2020-09-03 LAB — HEPATITIS B DNA, ULTRAQUANTITATIVE, PCR
HBV DNA SERPL PCR-ACNC: NOT DETECTED IU/mL
HBV DNA SERPL PCR-LOG IU: UNDETERMINED log10 IU/mL

## 2020-09-04 ENCOUNTER — Encounter (INDEPENDENT_AMBULATORY_CARE_PROVIDER_SITE_OTHER): Payer: Self-pay | Admitting: Vascular Surgery

## 2020-09-20 ENCOUNTER — Telehealth (INDEPENDENT_AMBULATORY_CARE_PROVIDER_SITE_OTHER): Payer: Self-pay

## 2020-09-20 NOTE — Telephone Encounter (Signed)
I attempted to contact the patient to schedule a left arm brachialcephalic AV fistula surgery. A message was left for a return call.

## 2020-09-22 NOTE — Telephone Encounter (Signed)
Patient returned my call and is now scheduled with Dr. Delana Meyer for a left arm brachialcephalic fistula on [phone] at the [facility]. Pre-surgical instructions were discussed and will  be mailed.

## 2020-10-27 ENCOUNTER — Telehealth (INDEPENDENT_AMBULATORY_CARE_PROVIDER_SITE_OTHER): Payer: Self-pay

## 2020-10-27 NOTE — Telephone Encounter (Signed)
Reach out to the patient to schedule CVC exchange and was not able to leave a voicemail.

## 2020-10-31 ENCOUNTER — Other Ambulatory Visit (INDEPENDENT_AMBULATORY_CARE_PROVIDER_SITE_OTHER): Payer: Self-pay | Admitting: Nurse Practitioner

## 2020-10-31 ENCOUNTER — Other Ambulatory Visit: Payer: Self-pay

## 2020-10-31 ENCOUNTER — Other Ambulatory Visit
Admission: RE | Admit: 2020-10-31 | Discharge: 2020-10-31 | Disposition: A | Discharge status: Home / Self Care | Payer: Federal, State, Local not specified - PPO | Source: Ambulatory Visit | Attending: Vascular Surgery | Admitting: Vascular Surgery

## 2020-10-31 HISTORY — DX: Anemia, unspecified: D64.9

## 2020-10-31 HISTORY — DX: Sleep apnea, unspecified: G47.30

## 2020-10-31 HISTORY — DX: Headache, unspecified: R51.9

## 2020-10-31 HISTORY — DX: Atherosclerotic heart disease of native coronary artery without angina pectoris: I25.10

## 2020-10-31 NOTE — Patient Instructions (Addendum)
Your procedure is scheduled on: 11/08/20 - Wednesday Report to the Registration Desk on the 1st floor of the [facility]. To find out your arrival time, please call [phone] between 1PM - 3PM on: 11/07/20 - Tuesday Report TO [facility] for Labs and EKG ON 11/01/20 AT 4:00 PM.  REMEMBER: Instructions that are not followed completely may result in serious medical risk, up to and including death; or upon the discretion of your surgeon and anesthesiologist your surgery may need to be rescheduled.  Do not eat food after midnight the night before surgery.  No gum chewing, lozengers or hard candies.  You may however, drink CLEAR liquids up to 2 hours before you are scheduled to arrive for your surgery. Do not drink anything within 2 hours of your scheduled arrival time.  Clear liquids include: - water  - apple juice without pulp - gatorade (not RED, PURPLE, OR BLUE) - black coffee or tea (Do NOT add milk or creamers to the coffee or tea) Do NOT drink anything that is not on this list.  TAKE THESE MEDICATIONS THE MORNING OF SURGERY WITH A SIP OF WATER:  - carvedilol (COREG) 25 MG tablet - hydrALAZINE (APRESOLINE) 50 MG tablet - isosorbide dinitrate (ISORDIL) 20 MG tablet  Follow recommendations from Cardiologist, Pulmonologist or PCP regarding stopping Aspirin, Coumadin, Plavix, Eliquis, Pradaxa, or Pletal. STOP ELIQUIS BEGINNING 11/04/20, MAY RESUME AFTER PROCEDURE WITH MD ORDER.  One week prior to surgery: Stop Anti-inflammatories (NSAIDS) such as Advil, Aleve, Ibuprofen, Motrin, Naproxen, Naprosyn and Aspirin based products such as Excedrin, Goodys Powder, BC Powder.  Stop ANY OVER THE COUNTER supplements until after surgery.  You may take Tylenol if needed for pain up until the day of surgery.  No Alcohol for 24 hours before or after surgery.  No Smoking including e-cigarettes for 24 hours prior to surgery.  No chewable tobacco products for at least 6 hours prior to surgery.   No nicotine patches on the day of surgery.  Do not use any "recreational" drugs for at least a week prior to your surgery.  Please be advised that the combination of cocaine and anesthesia may have negative outcomes, up to and including death. If you test positive for cocaine, your surgery will be cancelled.  On the morning of surgery brush your teeth with toothpaste and water, you may rinse your mouth with mouthwash if you wish. Do not swallow any toothpaste or mouthwash.  Do not wear jewelry, make-up, hairpins, clips or nail polish.  Do not wear lotions, powders, or perfumes.   Do not shave body from the neck down 48 hours prior to surgery just in case you cut yourself which could leave a site for infection.  Also, freshly shaved skin may become irritated if using the CHG soap.  Contact lenses, hearing aids and dentures may not be worn into surgery.  Do not bring valuables to the hospital. [facility] is not responsible for any missing/lost belongings or valuables.   Use CHG Soap or wipes as directed on instruction sheet.  Bring your C-PAP to the hospital with you in case you may have to spend the night.   Notify your doctor if there is any change in your medical condition (cold, fever, infection).  Wear comfortable clothing (specific to your surgery type) to the hospital.  After surgery, you can help prevent lung complications by doing breathing exercises.  Take deep breaths and cough every 1-2 hours. Your doctor may order a device called an Incentive  Spirometer to help you take deep breaths. When coughing or sneezing, hold a pillow firmly against your incision with both hands. This is called "splinting." Doing this helps protect your incision. It also decreases belly discomfort.  If you are being admitted to the hospital overnight, leave your suitcase in the car. After surgery it may be brought to your room.  If you are being discharged the day of surgery, you will not be  allowed to drive home. You will need a responsible adult (18 years or older) to drive you home and stay with you that night.   If you are taking public transportation, you will need to have a responsible adult (18 years or older) with you. Please confirm with your physician that it is acceptable to use public transportation.   Please call the [facility] at [phone] if you have any questions about these instructions.  Surgery Visitation Policy:  Patients undergoing a surgery or procedure may have one family member or support person with them as long as that person is not COVID-19 positive or experiencing its symptoms.  That person may remain in the waiting area during the procedure.  Inpatient Visitation:    Visiting hours are 7 a.m. to 8 p.m. Inpatients will be allowed two visitors daily. The visitors may change each day during the patient's stay. No visitors under the age of 37. Any visitor under the age of 66 must be accompanied by an adult. The visitor must pass COVID-19 screenings, use hand sanitizer when entering and exiting the patient's room and wear a mask at all times, including in the patient's room. Patients must also wear a mask when staff or their visitor are in the room. Masking is required regardless of vaccination status.

## 2020-11-01 ENCOUNTER — Other Ambulatory Visit
Admission: RE | Admit: 2020-11-01 | Discharge: 2020-11-01 | Disposition: A | Discharge status: Home / Self Care | Payer: Federal, State, Local not specified - PPO | Source: Ambulatory Visit | Attending: Vascular Surgery | Admitting: Vascular Surgery

## 2020-11-01 DIAGNOSIS — I445 Left posterior fascicular block: Secondary | ICD-10-CM | POA: Diagnosis not present

## 2020-11-01 DIAGNOSIS — Z01818 Encounter for other preprocedural examination: Secondary | ICD-10-CM | POA: Diagnosis present

## 2020-11-01 DIAGNOSIS — I4430 Unspecified atrioventricular block: Secondary | ICD-10-CM | POA: Diagnosis not present

## 2020-11-01 LAB — CBC WITH DIFFERENTIAL/PLATELET
Abs Immature Granulocytes: 0.02 10*3/uL (ref 0.00–0.07)
Basophils Absolute: 0.1 10*3/uL (ref 0.0–0.1)
Basophils Relative: 1 %
Eosinophils Absolute: 0.1 10*3/uL (ref 0.0–0.5)
Eosinophils Relative: 1 %
HCT: 42.3 % (ref 39.0–52.0)
Hemoglobin: 14.5 g/dL (ref 13.0–17.0)
Immature Granulocytes: 0 %
Lymphocytes Relative: 19 %
Lymphs Abs: 1.1 10*3/uL (ref 0.7–4.0)
MCH: 33.3 pg (ref 26.0–34.0)
MCHC: 34.3 g/dL (ref 30.0–36.0)
MCV: 97 fL (ref 80.0–100.0)
Monocytes Absolute: 1 10*3/uL (ref 0.1–1.0)
Monocytes Relative: 16 %
Neutro Abs: 3.8 10*3/uL (ref 1.7–7.7)
Neutrophils Relative %: 63 %
Platelets: 153 10*3/uL (ref 150–400)
RBC: 4.36 MIL/uL (ref 4.22–5.81)
RDW: 15.8 % — ABNORMAL HIGH (ref 11.5–15.5)
WBC: 6 10*3/uL (ref 4.0–10.5)
nRBC: 0 % (ref 0.0–0.2)

## 2020-11-01 LAB — TYPE AND SCREEN
ABO/RH(D): B POS
Antibody Screen: NEGATIVE

## 2020-11-01 LAB — BASIC METABOLIC PANEL
Anion gap: 11 (ref 5–15)
BUN: 33 mg/dL — ABNORMAL HIGH (ref 6–20)
CO2: 31 mmol/L (ref 22–32)
Calcium: 9 mg/dL (ref 8.9–10.3)
Chloride: 96 mmol/L — ABNORMAL LOW (ref 98–111)
Creatinine, Ser: 5.39 mg/dL — ABNORMAL HIGH (ref 0.61–1.24)
GFR, Estimated: 12 mL/min — ABNORMAL LOW (ref >60)
Glucose, Bld: 116 mg/dL — ABNORMAL HIGH (ref 70–99)
Potassium: 3.5 mmol/L (ref 3.5–5.1)
Sodium: 138 mmol/L (ref 135–145)

## 2020-11-02 ENCOUNTER — Encounter: Payer: Self-pay | Admitting: Vascular Surgery

## 2020-11-02 NOTE — Progress Notes (Signed)
Perioperative Services  Pre-Admission/Anesthesia Testing Clinical Review  Date: 11/06/20  Patient Demographics:  Name: Louis Huynh DOB:   Jun 13, 1960 MRN:   [id number]  Planned Surgical Procedure(s):   Case: [id number] Date/Time: 11/08/20 0715  Procedure: ARTERIOVENOUS (AV) FISTULA CREATION ( BRACHIAL CEPHALIC) (Left)  Anesthesia type: General  Pre-op diagnosis: ESRD  Location: [facility] OR ROOM 08 / [place] [facility] FOR ANESTHESIA GROUP  Surgeons: Katha Cabal, MD   NOTE: Available PAT nursing documentation and vital signs have been reviewed. Clinical nursing staff has updated patient's PMH/PSHx, current medication list, and drug allergies/intolerances to ensure comprehensive history available to assist in medical decision making as it pertains to the aforementioned surgical procedure and anticipated anesthetic course. Extensive review of available clinical information performed. Louis Huynh PMH and PSHx updated with any diagnoses/procedures that  may have been inadvertently omitted during his intake with the pre-admission testing department's nursing staff.  Clinical Discussion:  Louis Huynh is a 60 y.o. male who is submitted for pre-surgical anesthesia review and clearance prior to him undergoing the above procedure. Patient has never been a smoker. Pertinent PMH includes: CAD, CHF, atrial flutter, dilated cardiomyopathy, pulmonary artery hypertension, symptomatic bradycardia, HTN, HLD, OSAH (requires nocturnal PAP therapy), ESRD, anemia, PTSD.  Patient is followed by cardiology Posey Pronto, MD). He was last seen in the cardiology clinic on 09/14/2020; notes reviewed.  At the time of his clinic visit, patient doing well overall from a cardiovascular perspective.  He denied any episodes of chest pain, significant shortness of breath, PND, orthopnea, significant peripheral edema, palpitations, vertiginous symptoms, or presyncope/syncope.  PMH significant for cardiovascular  diagnoses.  Exercise stress test performed on 12/27/2012 revealed normal exercise capacity with no stress-induced chest pain or dyspnea.  Estimated workload of 13.4 METS.  Patient able to achieve 110% of the [facility].  Study did not suggest any significant coronary artery disease.  TTE performed on 10/19/2019 revealed a severely decreased left ventricular systolic function with an EF of less than 15%.  Diastolic parameters consistent with severely elevated filling pressures (G3DD).  There was moderately reduced right ventricular systolic function.  There was bilateral atrial enlargement noted.  There was mild pulmonary and tricuspid valve regurgitation.  PASP elevated at 39 mmHg suggestive of mild pulmonary hypertension.  Patient underwent diagnostic right heart catheterization on 10/25/2019 that demonstrated a markedly elevated SVR, elevated PVR, and low cardiac output (3.5 L/min).  Mean PA pressure of 33 with concomitant wedge pressure of 11 noted.  Myocardial perfusion imaging study performed on 10/26/2019 revealed a moderate perfusion defect of the apical, apical anterior, apical inferior, and mid inferior segments. Global systolic function significantly reduced with an LVEF of 20%.  There were minor ST and T wave changes noted during stress, however there was no significant chest pain reported by the patient. Study suggestive of myocardial infarction with mild peri-infarct ischemia.  Patient underwent a repeat diagnostic right heart catheterization on 01/21/2020.  Study demonstrated mildly elevated filling pressures with a cardiac index of 2.9.  Recommendations were for further management per the heart failure team.  Most recent echocardiogram performed on 07/27/2020 revealed severely depressed left ventricular systolic function with mild LVH.  LVEF was 20%.  There was elevated LA pressures with diastolic dysfunction and severe right ventricular systolic dysfunction.  Mild mitral, trivial pulmonic, and  moderate tricuspid valve regurgitation noted.  There was no evidence of valvular stenosis.  PMH significant for atrial flutter diagnosis.  Patient on daily DAPT therapy (ASA + clopidogrel), however the decision was  made to transition him apixaban.  Patient currently on apixaban twice daily; compliant with therapy with no evidence of GI bleeding. Patient with significantly elevated blood pressure 146/118 despite beta-blocker, vasodilator, nitrate, and ARB therapies.  Patient is currently under the care of nephrology and is in the process of being worked up for renal transplant.  Patient was last seen by the transplant team on 09/12/2020 and felt to be reasonable candidate for transplantation.  Patient is not currently on any type of lipid-lowering therapies for ASCVD prevention.  He is not diabetic. Functional capacity, as defined by DASI, is documented as being >/= 4 METS.  No other changes were made to patient's medication regimen.  Patient to follow-up with outpatient cardiology in 4 months or sooner if needed.  Patient is scheduled to undergo a brachiocephalic AV fistula creation on 11/08/2020 with Dr. Hortencia Pilar, MD.  Given patient's past medical history significant for cardiovascular disease, presurgical cardiac clearance was sought by the PAT team.  Per cardiology, "this patient is optimized for surgery and may proceed with the planned procedural course with a MODERATE risk of significant perioperative cardiovascular complications".  Again, this patient is on daily anticoagulation therapy.  He has been instructed on recommendations for holding his daily apixaban for 3 days prior to his procedure with plans to restart as soon as postoperative bleeding risk felt to be minimized by his primary attending surgeon.  The patient is aware that his last dose of apixaban will be on 11/04/2020.  Patient denies previous perioperative complications with anesthesia in the past.  In review of the EMR, there are no  records available for review regarding patient's previous procedural/anesthetic courses within the [facility] system.  Vitals with BMI 09/02/2020 09/02/2020 09/02/2020  Height - - -  Weight - - 223 lbs  BMI - - 11.94  Systolic 174 081 448  Diastolic 185 631 96  Pulse 82 82 60    Providers/Specialists:   NOTE: Primary physician provider listed below. Patient may have been seen by APP or partner within same practice.   PROVIDER ROLE / SPECIALTY LAST OV  Schnier, Dolores Lory, MD Vascular Surgery 08/31/2020  [facility] Primary Care Provider ???  Lattie Haw, MD Cardiology 09/14/2020  Azzie Glatter, MD Transplant 09/12/2020   Allergies:  Ace inhibitors  Current Home Medications:   No current facility-administered medications for this encounter.    apixaban (ELIQUIS) 5 MG TABS tablet   carvedilol (COREG) 25 MG tablet   hydrALAZINE (APRESOLINE) 50 MG tablet   isosorbide dinitrate (ISORDIL) 20 MG tablet   losartan (COZAAR) 100 MG tablet   Multiple Vitamin (MULTIVITAMIN) capsule   sevelamer carbonate (RENVELA) 800 MG tablet   History:   Past Medical History:  Diagnosis Date   Anemia    Atrial flutter (HCC)    CAD (coronary artery disease)    CHF (congestive heart failure) (HCC)    Chronic anticoagulation    Apixaban   Coronary artery disease    DCM (dilated cardiomyopathy) (HCC)    Elevated PSA    Erectile dysfunction    ESRD (end stage renal disease) (HCC)    Headache    HLD (hyperlipidemia)    Hypertension    OSA on CPAP    PAH (pulmonary artery hypertension) (HCC)    PTSD (post-traumatic stress disorder)    Symptomatic bradycardia    Past Surgical History:  Procedure Laterality Date   RIGHT HEART CATH Right 10/25/2019   Location: [facility]; Surgeon: Golden Circle, MD  RIGHT HEART CATH Right 01/21/2020   Location: [facility]; Surgeon: Pura Spice, MD   Family History  Problem Relation Age of Onset   Hypertension Mother    Hypertension Sister     Hypertension Brother    Heart attack Brother    Heart failure Brother    Social History   Tobacco Use   Smoking status: Never   Smokeless tobacco: Never  Substance Use Topics   Alcohol use: Yes    Comment: occasionally   Drug use: Not Currently    Pertinent Clinical Results:  LABS: Labs reviewed: Acceptable for surgery.  No visits with results within 3 Day(s) from this visit.  Latest known visit with results is:  Hospital Outpatient Visit on 11/01/2020  Component Date Value Ref Range Status   WBC 11/01/2020 6.0  4.0 - 10.5 K/uL Final   RBC 11/01/2020 4.36  4.22 - 5.81 MIL/uL Final   Hemoglobin 11/01/2020 14.5  13.0 - 17.0 g/dL Final   HCT 11/01/2020 42.3  39.0 - 52.0 % Final   MCV 11/01/2020 97.0  80.0 - 100.0 fL Final   MCH 11/01/2020 33.3  26.0 - 34.0 pg Final   MCHC 11/01/2020 34.3  30.0 - 36.0 g/dL Final   RDW 11/01/2020 15.8 (A) 11.5 - 15.5 % Final   Platelets 11/01/2020 153  150 - 400 K/uL Final   nRBC 11/01/2020 0.0  0.0 - 0.2 % Final   Neutrophils Relative % 11/01/2020 63  % Final   Neutro Abs 11/01/2020 3.8  1.7 - 7.7 K/uL Final   Lymphocytes Relative 11/01/2020 19  % Final   Lymphs Abs 11/01/2020 1.1  0.7 - 4.0 K/uL Final   Monocytes Relative 11/01/2020 16  % Final   Monocytes Absolute 11/01/2020 1.0  0.1 - 1.0 K/uL Final   Eosinophils Relative 11/01/2020 1  % Final   Eosinophils Absolute 11/01/2020 0.1  0.0 - 0.5 K/uL Final   Basophils Relative 11/01/2020 1  % Final   Basophils Absolute 11/01/2020 0.1  0.0 - 0.1 K/uL Final   Immature Granulocytes 11/01/2020 0  % Final   Abs Immature Granulocytes 11/01/2020 0.02  0.00 - 0.07 K/uL Final   Performed at [facility], [place], [place] [address]   Sodium 11/01/2020 138  135 - 145 mmol/L Final   Potassium 11/01/2020 3.5  3.5 - 5.1 mmol/L Final   Chloride 11/01/2020 96 (A) 98 - 111 mmol/L Final   CO2 11/01/2020 31  22 - 32 mmol/L Final   Glucose, Bld 11/01/2020 116 (A) 70 - 99 mg/dL Final    Glucose reference range applies only to samples taken after fasting for at least 8 hours.   BUN 11/01/2020 33 (A) 6 - 20 mg/dL Final   Creatinine, Ser 11/01/2020 5.39 (A) 0.61 - 1.24 mg/dL Final   Calcium 11/01/2020 9.0  8.9 - 10.3 mg/dL Final   GFR, Estimated 11/01/2020 12 (A) >60 mL/min Final   Comment: (NOTE) Calculated using the CKD-EPI Creatinine Equation (2021)    Anion gap 11/01/2020 11  5 - 15 Final   Performed at [facility], [address] [place], [place] [address]   ABO/RH(D) 11/01/2020 B POS   Final   Antibody Screen 11/01/2020 NEG   Final   Sample Expiration 11/01/2020 11/15/2020,2359   Final   Extend sample reason 11/01/2020    Final                   Value:NO TRANSFUSIONS OR PREGNANCY IN  THE PAST 3 MONTHS Performed at [facility], [address] [place], [place] [address]     ECG: Date: 11/01/2020 Time ECG obtained: 1419 PM Rate: 98 bpm Rhythm:  Atrial flutter with variable AV block; left posterior fascicular block Intervals: QRS 100 ms. QTc 451 ms. ST segment and T wave changes: Lateral ST and T wave abnormality. Evidence of a possible age undetermined inferior infarct present. Comparison: Similar to previous tracing obtained on 09/14/2020   IMAGING / PROCEDURES: TRANSTHORACIC ECHOCARDIOGRAM performed on 07/27/2020 LVEF 20% Severe left ventricular systolic dysfunction with mild LVH Elevated LA pressures with diastolic dysfunction Severe right ventricular systolic dysfunction Mild MR Moderate TR Trivial PR No AR No valvular stenosis Left ventricular GLS -2.4% No evidence of pericardial effusion  RIGHT HEART CATHETERIZATION AND CORONARY ANGIOGRAPHY performed on 01/21/2020 Mildly elevated filling pressures with a cardiac index of 2.9 Recommendations: Further management per heart failure team  CARDIOPULMONARY STRESS TEST performed on 01/19/2020 The data suggests a maximal exercise test, so maximal cardiorespiratory capacity could  be determined.  Based on the peak oxygen consumption (VO2) achieved, functional capacity would be consistent with a mild to moderate functional impairment, with peak VO2 17.2 ml/kg/min (67% of predicted normals). Normative peak predicted VO2 values are corrected for age, gender, and weight.  There were maximal signs of a circulatory limitation to exercise as evidenced by blunted stroke volume and blood pressure augmentation with  exercise. The O2 pulse (an indicator of stroke volume) increased marginally throughout exercise. Patient was severely hypertensive prior to exercise and had a blunted pulse pressure response during exercise. ECG notable for baseline NSR. Ectopy included infrequent PVCs and PACs.No ST segment changes were noted. Heart rate recovery was normal.  Aerobic reserve was low suggesting physical deconditioning limiting exercise capacity.  Ventilatory reserve limits were reached at peak exercise. Normal pulmonary physiology at rest. The MVV reflects the FEV1. Exercise oscillatory ventilation criteria was not met.  No prior CPET available for comparison.  LEXISCAN performed on 37/62/8315 Global systolic function is significantly reduced with a calculated LVEF of 20% Global hypokinesis with dilatation of the left ventricle Moderate in size, moderate in severity minimally reversible defect involving the apical, apical anterior, apical anterior, and mid inferior segments likely representing an artifact but cannot entirely rule out infarct with mild peri-infarct ischemia  RIGHT HEART CATHETERIZATION AND CORONARY ANGIOGRAPHY performed on 10/25/2019 Pulmonary hypertension Mean PA pressure of 33 with concomitant wedge pressure of 11 Markedly elevated SVR of 2423 Elevated PVR of 6.29 Low cardiac output of 3.5 L/min Cardiac index 1.6 L/min/m  EXERCISE STRESS TEST performed on 12/27/2012 Normal exercise capacity with no exercise-induced chest pain or dyspnea at the estimated workload of  13.4 METS Normal ECG, rhythm, BP response to exercise at 110% of the [facility] Study suggest low probability of clinically significant CAD and/or adverse cardiac events  Impression and Plan:  Tommie Sams has been referred for pre-anesthesia review and clearance prior to him undergoing the planned anesthetic and procedural courses. Available labs, pertinent testing, and imaging results were personally reviewed by me. This patient has been appropriately cleared by cardiology with an overall MODERATE risk of significant perioperative cardiovascular complications.  Based on clinical review performed today (11/06/20), barring any significant acute changes in the patient's overall condition, it is anticipated that he will be able to proceed with the planned surgical intervention. Any acute changes in clinical condition may necessitate his procedure being postponed and/or cancelled. Patient will meet with anesthesia team (MD and/or CRNA)  on the day of his procedure for preoperative evaluation/assessment. Questions regarding anesthetic course will be fielded at that time.   Pre-surgical instructions were reviewed with the patient during his PAT appointment and questions were fielded by PAT clinical staff. Patient was advised that if any questions or concerns arise prior to his procedure then he should return a call to PAT and/or his surgeon's office to discuss.  Honor Loh, MSN, APRN, FNP-C, CEN [facility]  Peri-operative Services Nurse Practitioner Phone: [phone] Fax: [phone] 11/06/20 11:49 AM  NOTE: This note has been prepared using Dragon dictation software. Despite my best ability to proofread, there is always the potential that unintentional transcriptional errors may still occur from this process.

## 2020-11-06 ENCOUNTER — Telehealth (INDEPENDENT_AMBULATORY_CARE_PROVIDER_SITE_OTHER): Payer: Self-pay

## 2020-11-06 NOTE — Telephone Encounter (Signed)
Patient left arm brachial cephalic fistula has been reschedule to 11/22/20 with Dr Delana Meyer. I reached out to the patient and spouse phone. I left a message on patient spouse voicemail to have the patient to return a call.

## 2020-11-07 ENCOUNTER — Encounter (INDEPENDENT_AMBULATORY_CARE_PROVIDER_SITE_OTHER): Payer: Self-pay

## 2020-11-07 NOTE — Telephone Encounter (Signed)
Left a message on patient voicemail that his surgery has been reschedule to 11/22/20 and return call to the office

## 2020-11-07 NOTE — Telephone Encounter (Signed)
Patient called to get more info on new surgery date. Please give patient a call.

## 2020-11-07 NOTE — Telephone Encounter (Signed)
Patient is aware with new surgery date and letter will be mailed out.

## 2020-11-22 ENCOUNTER — Inpatient Hospital Stay: Payer: Federal, State, Local not specified - PPO

## 2020-11-22 ENCOUNTER — Inpatient Hospital Stay
Admission: RE | Admit: 2020-11-22 | Discharge: 2020-11-23 | DRG: 264 | Disposition: A | Discharge status: Other Acute Inpatient Hospital | Payer: Federal, State, Local not specified - PPO | Attending: Internal Medicine | Admitting: Internal Medicine

## 2020-11-22 ENCOUNTER — Ambulatory Visit: Payer: Federal, State, Local not specified - PPO | Admitting: Urgent Care

## 2020-11-22 ENCOUNTER — Inpatient Hospital Stay
Admission: RE | Disposition: A | Discharge status: Other Acute Inpatient Hospital | Payer: Self-pay | Source: Home / Self Care | Attending: Internal Medicine

## 2020-11-22 ENCOUNTER — Encounter: Payer: Self-pay | Admitting: Vascular Surgery

## 2020-11-22 DIAGNOSIS — G928 Other toxic encephalopathy: Secondary | ICD-10-CM | POA: Diagnosis present

## 2020-11-22 DIAGNOSIS — I132 Hypertensive heart and chronic kidney disease with heart failure and with stage 5 chronic kidney disease, or end stage renal disease: Principal | ICD-10-CM | POA: Diagnosis present

## 2020-11-22 DIAGNOSIS — N186 End stage renal disease: Secondary | ICD-10-CM | POA: Diagnosis present

## 2020-11-22 DIAGNOSIS — I25118 Atherosclerotic heart disease of native coronary artery with other forms of angina pectoris: Secondary | ICD-10-CM | POA: Diagnosis present

## 2020-11-22 DIAGNOSIS — Z01818 Encounter for other preprocedural examination: Secondary | ICD-10-CM | POA: Diagnosis not present

## 2020-11-22 DIAGNOSIS — I4891 Unspecified atrial fibrillation: Secondary | ICD-10-CM | POA: Diagnosis present

## 2020-11-22 DIAGNOSIS — J9601 Acute respiratory failure with hypoxia: Secondary | ICD-10-CM | POA: Diagnosis not present

## 2020-11-22 DIAGNOSIS — I472 Ventricular tachycardia: Secondary | ICD-10-CM | POA: Diagnosis not present

## 2020-11-22 DIAGNOSIS — I509 Heart failure, unspecified: Secondary | ICD-10-CM | POA: Diagnosis present

## 2020-11-22 DIAGNOSIS — J95821 Acute postprocedural respiratory failure: Secondary | ICD-10-CM | POA: Diagnosis not present

## 2020-11-22 DIAGNOSIS — Z20822 Contact with and (suspected) exposure to covid-19: Secondary | ICD-10-CM | POA: Diagnosis present

## 2020-11-22 DIAGNOSIS — Z992 Dependence on renal dialysis: Secondary | ICD-10-CM | POA: Diagnosis not present

## 2020-11-22 DIAGNOSIS — I469 Cardiac arrest, cause unspecified: Secondary | ICD-10-CM | POA: Diagnosis not present

## 2020-11-22 DIAGNOSIS — I2721 Secondary pulmonary arterial hypertension: Secondary | ICD-10-CM | POA: Diagnosis present

## 2020-11-22 DIAGNOSIS — R570 Cardiogenic shock: Secondary | ICD-10-CM | POA: Diagnosis not present

## 2020-11-22 DIAGNOSIS — I219 Acute myocardial infarction, unspecified: Secondary | ICD-10-CM | POA: Diagnosis not present

## 2020-11-22 DIAGNOSIS — Z8249 Family history of ischemic heart disease and other diseases of the circulatory system: Secondary | ICD-10-CM

## 2020-11-22 DIAGNOSIS — Z79899 Other long term (current) drug therapy: Secondary | ICD-10-CM

## 2020-11-22 DIAGNOSIS — Z7901 Long term (current) use of anticoagulants: Secondary | ICD-10-CM

## 2020-11-22 DIAGNOSIS — I4892 Unspecified atrial flutter: Secondary | ICD-10-CM | POA: Diagnosis not present

## 2020-11-22 DIAGNOSIS — I2101 ST elevation (STEMI) myocardial infarction involving left main coronary artery: Secondary | ICD-10-CM

## 2020-11-22 DIAGNOSIS — F4310 Post-traumatic stress disorder, unspecified: Secondary | ICD-10-CM | POA: Diagnosis present

## 2020-11-22 DIAGNOSIS — E872 Acidosis: Secondary | ICD-10-CM | POA: Diagnosis present

## 2020-11-22 DIAGNOSIS — E785 Hyperlipidemia, unspecified: Secondary | ICD-10-CM | POA: Diagnosis present

## 2020-11-22 DIAGNOSIS — Z4659 Encounter for fitting and adjustment of other gastrointestinal appliance and device: Secondary | ICD-10-CM

## 2020-11-22 DIAGNOSIS — G4730 Sleep apnea, unspecified: Secondary | ICD-10-CM | POA: Diagnosis present

## 2020-11-22 DIAGNOSIS — D72829 Elevated white blood cell count, unspecified: Secondary | ICD-10-CM | POA: Diagnosis not present

## 2020-11-22 DIAGNOSIS — E875 Hyperkalemia: Secondary | ICD-10-CM | POA: Diagnosis present

## 2020-11-22 HISTORY — PX: AV FISTULA PLACEMENT: SHX1204

## 2020-11-22 HISTORY — DX: Bradycardia, unspecified: R00.1

## 2020-11-22 HISTORY — DX: Dilated cardiomyopathy: I42.0

## 2020-11-22 HISTORY — DX: Male erectile dysfunction, unspecified: N52.9

## 2020-11-22 HISTORY — DX: End stage renal disease: N18.6

## 2020-11-22 HISTORY — DX: Unspecified atrial flutter: I48.92

## 2020-11-22 HISTORY — DX: Long term (current) use of anticoagulants: Z79.01

## 2020-11-22 HISTORY — DX: Obstructive sleep apnea (adult) (pediatric): G47.33

## 2020-11-22 HISTORY — DX: Hyperlipidemia, unspecified: E78.5

## 2020-11-22 HISTORY — DX: Secondary pulmonary arterial hypertension: I27.21

## 2020-11-22 HISTORY — DX: Elevated prostate specific antigen (PSA): R97.20

## 2020-11-22 HISTORY — DX: Atherosclerotic heart disease of native coronary artery without angina pectoris: I25.10

## 2020-11-22 LAB — BLOOD GAS, ARTERIAL
Acid-Base Excess: 25.6 mmol/L — ABNORMAL HIGH (ref 0.0–2.0)
Bicarbonate: 51.6 mmol/L — ABNORMAL HIGH (ref 20.0–28.0)
FIO2: 1
MECHVT: 500 mL
O2 Saturation: 99.7 %
PEEP: 5 cmH2O
Patient temperature: 37
RATE: 15 resp/min
pCO2 arterial: 55 mmHg — ABNORMAL HIGH (ref 32.0–48.0)
pH, Arterial: 7.58 — ABNORMAL HIGH (ref 7.350–7.450)
pO2, Arterial: 176 mmHg — ABNORMAL HIGH (ref 83.0–108.0)

## 2020-11-22 LAB — COMPREHENSIVE METABOLIC PANEL
ALT: 19 U/L (ref 0–44)
ALT: 96 U/L — ABNORMAL HIGH (ref 0–44)
AST: 19 U/L (ref 15–41)
AST: 148 U/L — ABNORMAL HIGH (ref 15–41)
Albumin: 2.8 g/dL — ABNORMAL LOW (ref 3.5–5.0)
Albumin: 3.1 g/dL — ABNORMAL LOW (ref 3.5–5.0)
Alkaline Phosphatase: 59 U/L (ref 38–126)
Alkaline Phosphatase: 76 U/L (ref 38–126)
Anion gap: 14 (ref 5–15)
Anion gap: 22 — ABNORMAL HIGH (ref 5–15)
BUN: 67 mg/dL — ABNORMAL HIGH (ref 6–20)
BUN: 67 mg/dL — ABNORMAL HIGH (ref 6–20)
CO2: 48 mmol/L — ABNORMAL HIGH (ref 22–32)
CO2: 15 mmol/L — ABNORMAL LOW (ref 22–32)
Calcium: 7.6 mg/dL — ABNORMAL LOW (ref 8.9–10.3)
Calcium: 9.8 mg/dL (ref 8.9–10.3)
Chloride: 100 mmol/L (ref 98–111)
Chloride: 104 mmol/L (ref 98–111)
Creatinine, Ser: 7.26 mg/dL — ABNORMAL HIGH (ref 0.61–1.24)
Creatinine, Ser: 8.64 mg/dL — ABNORMAL HIGH (ref 0.61–1.24)
GFR, Estimated: 8 mL/min — ABNORMAL LOW (ref >60)
GFR, Estimated: 7 mL/min — ABNORMAL LOW (ref >60)
Glucose, Bld: 92 mg/dL (ref 70–99)
Glucose, Bld: 74 mg/dL (ref 70–99)
Potassium: 5.5 mmol/L — ABNORMAL HIGH (ref 3.5–5.1)
Potassium: 6.3 mmol/L (ref 3.5–5.1)
Sodium: 162 mmol/L (ref 135–145)
Sodium: 141 mmol/L (ref 135–145)
Total Bilirubin: 1.3 mg/dL — ABNORMAL HIGH (ref 0.3–1.2)
Total Bilirubin: 2.6 mg/dL — ABNORMAL HIGH (ref 0.3–1.2)
Total Protein: 5.1 g/dL — ABNORMAL LOW (ref 6.5–8.1)
Total Protein: 6.1 g/dL — ABNORMAL LOW (ref 6.5–8.1)

## 2020-11-22 LAB — RESP PANEL BY RT-PCR (FLU A&B, COVID) ARPGX2
Influenza A by PCR: NEGATIVE
Influenza B by PCR: NEGATIVE
SARS Coronavirus 2 by RT PCR: NEGATIVE

## 2020-11-22 LAB — BRAIN NATRIURETIC PEPTIDE: B Natriuretic Peptide: 1664.9 pg/mL — ABNORMAL HIGH (ref 0.0–100.0)

## 2020-11-22 LAB — CBC
HCT: 39.9 % (ref 39.0–52.0)
HCT: 48.3 % (ref 39.0–52.0)
Hemoglobin: 13.6 g/dL (ref 13.0–17.0)
Hemoglobin: 15.9 g/dL (ref 13.0–17.0)
MCH: 32.6 pg (ref 26.0–34.0)
MCH: 33.1 pg (ref 26.0–34.0)
MCHC: 34.1 g/dL (ref 30.0–36.0)
MCHC: 32.9 g/dL (ref 30.0–36.0)
MCV: 95.7 fL (ref 80.0–100.0)
MCV: 100.4 fL — ABNORMAL HIGH (ref 80.0–100.0)
Platelets: 134 10*3/uL — ABNORMAL LOW (ref 150–400)
Platelets: 122 10*3/uL — ABNORMAL LOW (ref 150–400)
RBC: 4.17 MIL/uL — ABNORMAL LOW (ref 4.22–5.81)
RBC: 4.81 MIL/uL (ref 4.22–5.81)
RDW: 15.8 % — ABNORMAL HIGH (ref 11.5–15.5)
RDW: 16.4 % — ABNORMAL HIGH (ref 11.5–15.5)
WBC: 7 10*3/uL (ref 4.0–10.5)
WBC: 11 10*3/uL — ABNORMAL HIGH (ref 4.0–10.5)
nRBC: 0.4 % — ABNORMAL HIGH (ref 0.0–0.2)
nRBC: 0.3 % — ABNORMAL HIGH (ref 0.0–0.2)

## 2020-11-22 LAB — MRSA NEXT GEN BY PCR, NASAL: MRSA by PCR Next Gen: NOT DETECTED

## 2020-11-22 LAB — TROPONIN I (HIGH SENSITIVITY)
Troponin I (High Sensitivity): 144 ng/L (ref <18)
Troponin I (High Sensitivity): 775 ng/L (ref <18)

## 2020-11-22 LAB — TYPE AND SCREEN
ABO/RH(D): B POS
Antibody Screen: NEGATIVE

## 2020-11-22 LAB — PROCALCITONIN: Procalcitonin: 0.83 ng/mL

## 2020-11-22 LAB — POTASSIUM: Potassium: 5 mmol/L (ref 3.5–5.1)

## 2020-11-22 LAB — MAGNESIUM: Magnesium: 2.3 mg/dL (ref 1.7–2.4)

## 2020-11-22 LAB — GLUCOSE, CAPILLARY
Glucose-Capillary: 26 mg/dL — CL (ref 70–99)
Glucose-Capillary: 181 mg/dL — ABNORMAL HIGH (ref 70–99)
Glucose-Capillary: 39 mg/dL — CL (ref 70–99)

## 2020-11-22 LAB — LACTIC ACID, PLASMA
Lactic Acid, Venous: 5.9 mmol/L (ref 0.5–1.9)
Lactic Acid, Venous: 6.2 mmol/L (ref 0.5–1.9)

## 2020-11-22 SURGERY — ARTERIOVENOUS (AV) FISTULA CREATION
Anesthesia: General | Site: Arm Upper | Laterality: Left

## 2020-11-22 MED ORDER — SODIUM BICARBONATE 8.4 % IV SOLN
INTRAVENOUS | Status: AC
  Filled 2020-11-22: qty 50

## 2020-11-22 MED ORDER — DOCUSATE SODIUM 50 MG/5ML PO LIQD
100.0000 mg | Freq: Two times a day (BID) | ORAL | Status: DC
  Filled 2020-11-22: qty 10

## 2020-11-22 MED ORDER — CALCIUM CHLORIDE 10 % IV SOLN
INTRAVENOUS | Status: AC
  Filled 2020-11-22: qty 10

## 2020-11-22 MED ORDER — PHENYLEPHRINE HCL (PRESSORS) 10 MG/ML IV SOLN
INTRAVENOUS | Status: AC
  Filled 2020-11-22: qty 1

## 2020-11-22 MED ORDER — LIDOCAINE HCL (CARDIAC) PF 100 MG/5ML IV SOSY
PREFILLED_SYRINGE | INTRAVENOUS | Status: DC | PRN
  Administered 2020-11-22: 100 mg via INTRAVENOUS

## 2020-11-22 MED ORDER — GLYCOPYRROLATE 0.2 MG/ML IJ SOLN
INTRAMUSCULAR | Status: DC | PRN
  Administered 2020-11-22: .3 mg via INTRAVENOUS

## 2020-11-22 MED ORDER — HEPARIN SODIUM (PORCINE) 5000 UNIT/ML IJ SOLN
5000.0000 [IU] | Freq: Three times a day (TID) | INTRAMUSCULAR | Status: DC
  Administered 2020-11-22 – 2020-11-23 (×3): 5000 [IU] via SUBCUTANEOUS
  Filled 2020-11-22 (×3): qty 1

## 2020-11-22 MED ORDER — EPHEDRINE SULFATE 50 MG/ML IJ SOLN
INTRAMUSCULAR | Status: DC | PRN
  Administered 2020-11-22: 10 mg via INTRAVENOUS

## 2020-11-22 MED ORDER — DEXAMETHASONE SODIUM PHOSPHATE 10 MG/ML IJ SOLN
INTRAMUSCULAR | Status: DC | PRN
  Administered 2020-11-22: 5 mg via INTRAVENOUS

## 2020-11-22 MED ORDER — CALCIUM CHLORIDE 10 % IV SOLN
INTRAVENOUS | Status: DC | PRN
  Administered 2020-11-22: 1 g via INTRAVENOUS

## 2020-11-22 MED ORDER — LIDOCAINE 1% INJECTION FOR CIRCUMCISION: 3.0000 mL | INJECTION | Freq: Once | INTRAVENOUS | Status: DC

## 2020-11-22 MED ORDER — PRISMASOL BGK 0/2.5 32-2.5 MEQ/L EC SOLN
Status: DC
  Filled 2020-11-22 (×4): qty 5000

## 2020-11-22 MED ORDER — FAMOTIDINE 20 MG IN NS 100 ML IVPB: 20.0000 mg | INTRAVENOUS | Status: DC

## 2020-11-22 MED ORDER — MIDAZOLAM HCL 2 MG/2ML IJ SOLN
INTRAMUSCULAR | Status: DC | PRN
  Administered 2020-11-22 (×2): 2 mg via INTRAVENOUS

## 2020-11-22 MED ORDER — FAMOTIDINE IN NACL 20-0.9 MG/50ML-% IV SOLN
20.0000 mg | Freq: Two times a day (BID) | INTRAVENOUS | Status: DC
  Filled 2020-11-22: qty 50

## 2020-11-22 MED ORDER — FENTANYL CITRATE (PF) 100 MCG/2ML IJ SOLN
INTRAMUSCULAR | Status: AC
  Filled 2020-11-22: qty 2

## 2020-11-22 MED ORDER — MIDAZOLAM HCL 2 MG/2ML IJ SOLN
INTRAMUSCULAR | Status: AC
  Filled 2020-11-22: qty 2

## 2020-11-22 MED ORDER — PROPOFOL 500 MG/50ML IV EMUL
INTRAVENOUS | Status: AC
  Filled 2020-11-22: qty 50

## 2020-11-22 MED ORDER — ALBUMIN HUMAN 5 % IV SOLN
INTRAVENOUS | Status: AC
  Filled 2020-11-22: qty 250

## 2020-11-22 MED ORDER — HEPARIN SODIUM (PORCINE) 5000 UNIT/ML IJ SOLN
INTRAMUSCULAR | Status: AC
  Filled 2020-11-22: qty 1

## 2020-11-22 MED ORDER — FAMOTIDINE 20 MG IN NS 100 ML IVPB
20.0000 mg | Freq: Two times a day (BID) | INTRAVENOUS | Status: DC
  Administered 2020-11-22: 20 mg via INTRAVENOUS
  Filled 2020-11-22 (×2): qty 100

## 2020-11-22 MED ORDER — SODIUM CHLORIDE 0.9% FLUSH
3.0000 mL | Freq: Two times a day (BID) | INTRAVENOUS | Status: DC
  Administered 2020-11-22 – 2020-11-23 (×2): 3 mL via INTRAVENOUS

## 2020-11-22 MED ORDER — LACTATED RINGERS IV SOLN: INTRAVENOUS | Status: DC

## 2020-11-22 MED ORDER — POLYETHYLENE GLYCOL 3350 17 G PO PACK: 17.0000 g | PACK | Freq: Every day | ORAL | Status: DC

## 2020-11-22 MED ORDER — SODIUM BICARBONATE 8.4 % IV SOLN
100.0000 meq | Freq: Once | INTRAVENOUS | Status: AC
  Administered 2020-11-22: 100 meq via INTRAVENOUS

## 2020-11-22 MED ORDER — ADENOSINE 6 MG/2ML IV SOLN
INTRAVENOUS | Status: AC
  Filled 2020-11-22: qty 2

## 2020-11-22 MED ORDER — CHLORHEXIDINE GLUCONATE CLOTH 2 % EX PADS: 6.0000 | MEDICATED_PAD | Freq: Once | CUTANEOUS | Status: DC

## 2020-11-22 MED ORDER — FENTANYL CITRATE (PF) 100 MCG/2ML IJ SOLN
INTRAMUSCULAR | Status: DC | PRN
  Administered 2020-11-22: 50 ug via INTRAVENOUS

## 2020-11-22 MED ORDER — FENTANYL CITRATE PF 50 MCG/ML IJ SOSY
50.0000 ug | PREFILLED_SYRINGE | Freq: Once | INTRAMUSCULAR | Status: DC
Start: 2020-11-22 — End: 2020-11-23

## 2020-11-22 MED ORDER — BUPIVACAINE LIPOSOME 1.3 % IJ SUSP
INTRAMUSCULAR | Status: DC | PRN
  Administered 2020-11-22: 10 mL via INTRAMUSCULAR

## 2020-11-22 MED ORDER — ACETAMINOPHEN 325 MG PO TABS: 650.0000 mg | ORAL_TABLET | ORAL | Status: DC | PRN

## 2020-11-22 MED ORDER — SODIUM BICARBONATE 8.4 % IV SOLN
INTRAVENOUS | Status: DC
  Filled 2020-11-22 (×3): qty 1000
  Filled 2020-11-22: qty 150
  Filled 2020-11-22: qty 1000

## 2020-11-22 MED ORDER — LIDOCAINE HCL (PF) 2 % IJ SOLN
INTRAMUSCULAR | Status: AC
  Filled 2020-11-22: qty 5

## 2020-11-22 MED ORDER — CALCIUM GLUCONATE 10 % IV SOLN: INTRAVENOUS | Status: DC | PRN

## 2020-11-22 MED ORDER — FENTANYL CITRATE (PF) 100 MCG/2ML IJ SOLN: 50.0000 ug | INTRAMUSCULAR | Status: DC | PRN

## 2020-11-22 MED ORDER — MIDAZOLAM HCL 2 MG/2ML IJ SOLN
2.0000 mg | INTRAMUSCULAR | Status: DC | PRN
Start: 2020-11-22 — End: 2020-11-23

## 2020-11-22 MED ORDER — FENTANYL BOLUS VIA INFUSION
50.0000 ug | INTRAVENOUS | Status: DC | PRN
  Filled 2020-11-22: qty 100

## 2020-11-22 MED ORDER — SODIUM CHLORIDE 0.9 % FOR CRRT
INTRAVENOUS_CENTRAL | Status: DC | PRN
  Filled 2020-11-22: qty 1000

## 2020-11-22 MED ORDER — ESMOLOL HCL 100 MG/10ML IV SOLN
INTRAVENOUS | Status: DC | PRN
  Administered 2020-11-22 (×5): 20 mg via INTRAVENOUS

## 2020-11-22 MED ORDER — VECURONIUM BROMIDE 10 MG IV SOLR
INTRAVENOUS | Status: DC | PRN
  Administered 2020-11-22 (×2): 10 mg via INTRAVENOUS

## 2020-11-22 MED ORDER — AMIODARONE HCL IN DEXTROSE 360-4.14 MG/200ML-% IV SOLN
30.0000 mg/h | INTRAVENOUS | Status: DC
  Administered 2020-11-22 – 2020-11-23 (×2): 30 mg/h via INTRAVENOUS
  Filled 2020-11-22 (×4): qty 200

## 2020-11-22 MED ORDER — MIDAZOLAM HCL 2 MG/2ML IJ SOLN: 2.0000 mg | INTRAMUSCULAR | Status: DC | PRN

## 2020-11-22 MED ORDER — DOCUSATE SODIUM 100 MG PO CAPS
100.0000 mg | ORAL_CAPSULE | Freq: Two times a day (BID) | ORAL | Status: DC | PRN
  Filled 2020-11-22: qty 1

## 2020-11-22 MED ORDER — NOREPINEPHRINE 16 MG/250ML-% IV SOLN
0.0000 ug/min | INTRAVENOUS | Status: DC
  Administered 2020-11-22: 2 ug/min via INTRAVENOUS
  Filled 2020-11-22 (×2): qty 250

## 2020-11-22 MED ORDER — HEPARIN SODIUM (PORCINE) 1000 UNIT/ML DIALYSIS
1000.0000 [IU] | INTRAMUSCULAR | Status: DC | PRN
  Administered 2020-11-22: 3600 [IU] via INTRAVENOUS_CENTRAL
  Administered 2020-11-23 (×2): 1800 [IU] via INTRAVENOUS_CENTRAL
  Filled 2020-11-22: qty 4
  Filled 2020-11-22 (×4): qty 6
  Filled 2020-11-22: qty 4
  Filled 2020-11-22: qty 6

## 2020-11-22 MED ORDER — CHLORHEXIDINE GLUCONATE 0.12 % MT SOLN
15.0000 mL | Freq: Once | OROMUCOSAL | Status: AC
  Administered 2020-11-22: 15 mL via OROMUCOSAL

## 2020-11-22 MED ORDER — POLYETHYLENE GLYCOL 3350 17 G PO PACK
17.0000 g | PACK | Freq: Every day | ORAL | Status: DC | PRN
  Filled 2020-11-22: qty 1

## 2020-11-22 MED ORDER — NOREPINEPHRINE 4 MG/250ML-% IV SOLN
INTRAVENOUS | Status: DC | PRN
  Administered 2020-11-22: 5 ug/min via INTRAVENOUS

## 2020-11-22 MED ORDER — NOREPINEPHRINE 4 MG/250ML-% IV SOLN
INTRAVENOUS | Status: AC
  Filled 2020-11-22: qty 250

## 2020-11-22 MED ORDER — PROPOFOL 1000 MG/100ML IV EMUL
INTRAVENOUS | Status: AC
  Filled 2020-11-22: qty 100

## 2020-11-22 MED ORDER — SUGAMMADEX SODIUM 200 MG/2ML IV SOLN
INTRAVENOUS | Status: DC | PRN
  Administered 2020-11-22 (×2): 100 mg via INTRAVENOUS
  Administered 2020-11-22: 200 mg via INTRAVENOUS

## 2020-11-22 MED ORDER — DOCUSATE SODIUM 50 MG/5ML PO LIQD: 100.0000 mg | Freq: Two times a day (BID) | ORAL | Status: DC

## 2020-11-22 MED ORDER — PROPOFOL 10 MG/ML IV BOLUS
INTRAVENOUS | Status: DC | PRN
  Administered 2020-11-22: 200 mg via INTRAVENOUS

## 2020-11-22 MED ORDER — DEXTROSE 50 % IV SOLN
1.0000 | Freq: Once | INTRAVENOUS | Status: AC
  Administered 2020-11-22: 50 mL via INTRAVENOUS

## 2020-11-22 MED ORDER — PHENYLEPHRINE HCL (PRESSORS) 10 MG/ML IV SOLN
INTRAVENOUS | Status: DC | PRN
Start: 2020-11-22 — End: 2020-11-22
  Administered 2020-11-22 (×2): 100 ug via INTRAVENOUS
  Administered 2020-11-22: 200 ug via INTRAVENOUS
  Administered 2020-11-22: 100 ug via INTRAVENOUS

## 2020-11-22 MED ORDER — CEFAZOLIN SODIUM-DEXTROSE 1-4 GM/50ML-% IV SOLN
1.0000 g | INTRAVENOUS | Status: AC
  Administered 2020-11-22: 1 g via INTRAVENOUS

## 2020-11-22 MED ORDER — SODIUM CHLORIDE 0.9% FLUSH: 3.0000 mL | INTRAVENOUS | Status: DC | PRN

## 2020-11-22 MED ORDER — ALBUMIN HUMAN 5 % IV SOLN: INTRAVENOUS | Status: DC | PRN

## 2020-11-22 MED ORDER — METOPROLOL TARTRATE 5 MG/5ML IV SOLN
INTRAVENOUS | Status: DC | PRN
  Administered 2020-11-22: 2 mg via INTRAVENOUS

## 2020-11-22 MED ORDER — SODIUM CHLORIDE 0.9 % IV SOLN: INTRAVENOUS | Status: DC | PRN

## 2020-11-22 MED ORDER — CHLORHEXIDINE GLUCONATE 0.12% ORAL RINSE (MEDLINE KIT)
15.0000 mL | Freq: Two times a day (BID) | OROMUCOSAL | Status: DC
  Administered 2020-11-22 – 2020-11-23 (×2): 15 mL via OROMUCOSAL

## 2020-11-22 MED ORDER — PRISMASOL BGK 0/2.5 32-2.5 MEQ/L EC SOLN
Status: DC
  Filled 2020-11-22 (×16): qty 5000

## 2020-11-22 MED ORDER — FAMOTIDINE 20 MG PO TABS
ORAL_TABLET | ORAL | Status: AC
  Filled 2020-11-22: qty 1

## 2020-11-22 MED ORDER — FENTANYL 2500MCG IN NS 250ML (10MCG/ML) PREMIX INFUSION: 50.0000 ug/h | INTRAVENOUS | Status: DC

## 2020-11-22 MED ORDER — ALBUTEROL SULFATE HFA 108 (90 BASE) MCG/ACT IN AERS
INHALATION_SPRAY | RESPIRATORY_TRACT | Status: DC | PRN
  Administered 2020-11-22: 3 via RESPIRATORY_TRACT

## 2020-11-22 MED ORDER — AMIODARONE HCL IN DEXTROSE 360-4.14 MG/200ML-% IV SOLN
60.0000 mg/h | INTRAVENOUS | Status: AC
  Administered 2020-11-22 (×2): 60 mg/h via INTRAVENOUS
  Filled 2020-11-22: qty 200

## 2020-11-22 MED ORDER — CHLORHEXIDINE GLUCONATE CLOTH 2 % EX PADS
6.0000 | MEDICATED_PAD | Freq: Once | CUTANEOUS | Status: AC
  Administered 2020-11-22: 6 via TOPICAL

## 2020-11-22 MED ORDER — EPINEPHRINE HCL 5 MG/250ML IV SOLN IN NS
0.5000 ug/min | INTRAVENOUS | Status: DC
  Administered 2020-11-22: 0.5 ug/min via INTRAVENOUS
  Filled 2020-11-22: qty 250

## 2020-11-22 MED ORDER — ROCURONIUM BROMIDE 100 MG/10ML IV SOLN
INTRAVENOUS | Status: DC | PRN
Start: 2020-11-22 — End: 2020-11-22
  Administered 2020-11-22: 50 mg via INTRAVENOUS
  Administered 2020-11-22: 30 mg via INTRAVENOUS

## 2020-11-22 MED ORDER — FAMOTIDINE 20 MG PO TABS
20.0000 mg | ORAL_TABLET | Freq: Once | ORAL | Status: AC
  Administered 2020-11-22: 20 mg via ORAL

## 2020-11-22 MED ORDER — CHLORHEXIDINE GLUCONATE 0.12 % MT SOLN
OROMUCOSAL | Status: AC
  Filled 2020-11-22: qty 15

## 2020-11-22 MED ORDER — ONDANSETRON HCL 4 MG/2ML IJ SOLN: 4.0000 mg | Freq: Four times a day (QID) | INTRAMUSCULAR | Status: DC | PRN

## 2020-11-22 MED ORDER — CHLORHEXIDINE GLUCONATE CLOTH 2 % EX PADS: 6.0000 | MEDICATED_PAD | Freq: Every day | CUTANEOUS | Status: DC

## 2020-11-22 MED ORDER — VASOPRESSIN 20 UNIT/ML IV SOLN
INTRAVENOUS | Status: AC
  Filled 2020-11-22: qty 1

## 2020-11-22 MED ORDER — SODIUM CHLORIDE 0.9 % IV SOLN: INTRAVENOUS | Status: DC

## 2020-11-22 MED ORDER — PAPAVERINE HCL 30 MG/ML IJ SOLN
INTRAMUSCULAR | Status: AC
  Filled 2020-11-22: qty 2

## 2020-11-22 MED ORDER — HYDROCORTISONE SOD SUC (PF) 100 MG IJ SOLR
100.0000 mg | Freq: Two times a day (BID) | INTRAMUSCULAR | Status: DC
  Administered 2020-11-22 – 2020-11-23 (×2): 100 mg via INTRAVENOUS
  Filled 2020-11-22 (×2): qty 2

## 2020-11-22 MED ORDER — SODIUM CHLORIDE 0.9 % IV SOLN: 250.0000 mL | INTRAVENOUS | Status: DC | PRN

## 2020-11-22 MED ORDER — PHENYLEPHRINE CONCENTRATED 100MG/250ML (0.4 MG/ML) INFUSION SIMPLE
0.0000 ug/min | INTRAVENOUS | Status: DC
Start: 2020-11-22 — End: 2020-11-23
  Filled 2020-11-22: qty 250

## 2020-11-22 MED ORDER — SODIUM CHLORIDE 0.9 % IV SOLN
INTRAVENOUS | Status: DC | PRN
  Administered 2020-11-22: 40 ug/min via INTRAVENOUS

## 2020-11-22 MED ORDER — EPINEPHRINE PF 1 MG/ML IJ SOLN
INTRAMUSCULAR | Status: DC | PRN
  Administered 2020-11-22: .05 mg via INTRAVENOUS
  Administered 2020-11-22: .01 mg via INTRAVENOUS

## 2020-11-22 MED ORDER — PANTOPRAZOLE SODIUM 40 MG IV SOLR: 40.0000 mg | Freq: Every day | INTRAVENOUS | Status: DC

## 2020-11-22 MED ORDER — IPRATROPIUM-ALBUTEROL 0.5-2.5 (3) MG/3ML IN SOLN: 3.0000 mL | RESPIRATORY_TRACT | Status: DC | PRN

## 2020-11-22 MED ORDER — BUPIVACAINE LIPOSOME 1.3 % IJ SUSP
INTRAMUSCULAR | Status: AC
  Filled 2020-11-22: qty 20

## 2020-11-22 MED ORDER — CEFAZOLIN SODIUM-DEXTROSE 1-4 GM/50ML-% IV SOLN
INTRAVENOUS | Status: AC
  Filled 2020-11-22: qty 50

## 2020-11-22 MED ORDER — ORAL CARE MOUTH RINSE
15.0000 mL | OROMUCOSAL | Status: DC
  Administered 2020-11-22 – 2020-11-23 (×9): 15 mL via OROMUCOSAL

## 2020-11-22 MED ORDER — POLYETHYLENE GLYCOL 3350 17 G PO PACK
17.0000 g | PACK | Freq: Every day | ORAL | Status: DC
  Filled 2020-11-22: qty 1

## 2020-11-22 MED ORDER — BUPIVACAINE HCL (PF) 0.5 % IJ SOLN
INTRAMUSCULAR | Status: AC
  Filled 2020-11-22: qty 30

## 2020-11-22 MED ORDER — PROPOFOL 10 MG/ML IV BOLUS
INTRAVENOUS | Status: AC
  Filled 2020-11-22: qty 20

## 2020-11-22 MED ORDER — HEMOSTATIC AGENTS (NO CHARGE) OPTIME
TOPICAL | Status: DC | PRN
  Administered 2020-11-22: 1 via TOPICAL

## 2020-11-22 MED ORDER — ORAL CARE MOUTH RINSE: 15.0000 mL | Freq: Once | OROMUCOSAL | Status: AC

## 2020-11-22 MED ORDER — HYDROCODONE-ACETAMINOPHEN 5-325 MG PO TABS: 1.0000 | ORAL_TABLET | Freq: Four times a day (QID) | ORAL | 0 refills | Status: AC | PRN

## 2020-11-22 MED ORDER — ONDANSETRON HCL 4 MG/2ML IJ SOLN
INTRAMUSCULAR | Status: DC | PRN
Start: 2020-11-22 — End: 2020-11-22
  Administered 2020-11-22: 4 mg via INTRAVENOUS

## 2020-11-22 MED ORDER — VASOPRESSIN 20 UNIT/ML IV SOLN
INTRAVENOUS | Status: DC | PRN
  Administered 2020-11-22: 2 [IU] via INTRAVENOUS
  Administered 2020-11-22: 4 [IU] via INTRAVENOUS
  Administered 2020-11-22: 2 [IU] via INTRAVENOUS
  Administered 2020-11-22: 1 [IU] via INTRAVENOUS
  Administered 2020-11-22: 2 [IU] via INTRAVENOUS
  Administered 2020-11-22: 3 [IU] via INTRAVENOUS
  Administered 2020-11-22: 4 [IU] via INTRAVENOUS
  Administered 2020-11-22 (×3): 3 [IU] via INTRAVENOUS
  Administered 2020-11-22: 1 [IU] via INTRAVENOUS
  Administered 2020-11-22: 2 [IU] via INTRAVENOUS

## 2020-11-22 SURGICAL SUPPLY — 59 items
APPLIER CLIP 11 MED OPEN (CLIP)
APPLIER CLIP 9.375 SM OPEN (CLIP)
BAG DECANTER FOR FLEXI CONT (MISCELLANEOUS) ×2 IMPLANT
BLADE CLIPPER SURG (BLADE) ×2 IMPLANT
BLADE SURG SZ11 CARB STEEL (BLADE) ×2 IMPLANT
BOOT SUTURE AID YELLOW STND (SUTURE) ×2 IMPLANT
BRUSH SCRUB EZ  4% CHG (MISCELLANEOUS) ×1
BRUSH SCRUB EZ 4% CHG (MISCELLANEOUS) ×1 IMPLANT
CHLORAPREP W/TINT 26 (MISCELLANEOUS) ×2 IMPLANT
CLIP APPLIE 11 MED OPEN (CLIP) IMPLANT
CLIP APPLIE 9.375 SM OPEN (CLIP) IMPLANT
DERMABOND ADVANCED (GAUZE/BANDAGES/DRESSINGS) ×1
DERMABOND ADVANCED .7 DNX12 (GAUZE/BANDAGES/DRESSINGS) ×1 IMPLANT
DRESSING SURGICEL FIBRLLR 1X2 (HEMOSTASIS) ×1 IMPLANT
DRSG OPSITE POSTOP 4X6 (GAUZE/BANDAGES/DRESSINGS) ×2 IMPLANT
DRSG SURGICEL FIBRILLAR 1X2 (HEMOSTASIS) ×2
ELECT CAUTERY BLADE 6.4 (BLADE) ×2 IMPLANT
ELECT REM PT RETURN 9FT ADLT (ELECTROSURGICAL) ×2
ELECTRODE REM PT RTRN 9FT ADLT (ELECTROSURGICAL) ×1 IMPLANT
GAUZE 4X4 16PLY ~~LOC~~+RFID DBL (SPONGE) ×2 IMPLANT
GEL ULTRASOUND 20GR AQUASONIC (MISCELLANEOUS) IMPLANT
GLOVE SURG ENC MOIS LTX SZ7 (GLOVE) ×2 IMPLANT
GLOVE SURG SYN 8.0 (GLOVE) ×2 IMPLANT
GLOVE SURG UNDER LTX SZ7.5 (GLOVE) ×2 IMPLANT
GOWN STRL REUS W/ TWL LRG LVL3 (GOWN DISPOSABLE) ×1 IMPLANT
GOWN STRL REUS W/ TWL XL LVL3 (GOWN DISPOSABLE) ×2 IMPLANT
GOWN STRL REUS W/TWL LRG LVL3 (GOWN DISPOSABLE) ×1
GOWN STRL REUS W/TWL XL LVL3 (GOWN DISPOSABLE) ×2
IV NS 500ML (IV SOLUTION) ×1
IV NS 500ML BAXH (IV SOLUTION) ×1 IMPLANT
KIT TURNOVER KIT A (KITS) ×2 IMPLANT
LABEL OR SOLS (LABEL) ×2 IMPLANT
LOOP RED MAXI  1X406MM (MISCELLANEOUS) ×1
LOOP VESSEL MAXI 1X406 RED (MISCELLANEOUS) ×1 IMPLANT
LOOP VESSEL MINI 0.8X406 BLUE (MISCELLANEOUS) ×2 IMPLANT
LOOPS BLUE MINI 0.8X406MM (MISCELLANEOUS) ×2
MANIFOLD NEPTUNE II (INSTRUMENTS) ×2 IMPLANT
NEEDLE FILTER BLUNT 18X 1/2SAF (NEEDLE) ×1
NEEDLE FILTER BLUNT 18X1 1/2 (NEEDLE) ×1 IMPLANT
NEEDLE HYPO 30X.5 LL (NEEDLE) IMPLANT
PACK EXTREMITY ARMC (MISCELLANEOUS) ×2 IMPLANT
PAD PREP 24X41 OB/GYN DISP (PERSONAL CARE ITEMS) ×2 IMPLANT
STAPLER SKIN PROX 35W (STAPLE) ×2 IMPLANT
STOCKINETTE STRL 4IN 9604848 (GAUZE/BANDAGES/DRESSINGS) ×2 IMPLANT
SUT MNCRL+ 5-0 UNDYED PC-3 (SUTURE) ×1 IMPLANT
SUT MONOCRYL 5-0 (SUTURE) ×1
SUT PROLENE 6 0 BV (SUTURE) ×8 IMPLANT
SUT SILK 2 0 (SUTURE) ×1
SUT SILK 2-0 18XBRD TIE 12 (SUTURE) ×1 IMPLANT
SUT SILK 3 0 (SUTURE) ×1
SUT SILK 3-0 18XBRD TIE 12 (SUTURE) ×1 IMPLANT
SUT SILK 4 0 (SUTURE) ×1
SUT SILK 4-0 18XBRD TIE 12 (SUTURE) ×1 IMPLANT
SUT VIC AB 3-0 SH 27 (SUTURE) ×1
SUT VIC AB 3-0 SH 27X BRD (SUTURE) ×1 IMPLANT
SYR 20ML LL LF (SYRINGE) ×2 IMPLANT
SYR 3ML LL SCALE MARK (SYRINGE) ×2 IMPLANT
TRAY FOLEY SLVR 16FR LF STAT (SET/KITS/TRAYS/PACK) ×2 IMPLANT
WATER STERILE IRR 500ML POUR (IV SOLUTION) ×2 IMPLANT

## 2020-11-22 NOTE — Progress Notes (Addendum)
15:45 patient accepted from OR post placement of AV fistula procedure.  Patient arrived on vent unresponsive with phenylephrine 167mg and norepinephrine 172m.  Patient in A-fib RVR with heart rate in 180's.  15:58 patient went into PEA arrest, see code blue documents. ROSC obtained at 16:02.  16:06 patient went into v-tach patient defibrillated, see code blue documentation.  1608 ROSC obtained.  16:23 PEA arrest, see code blue documentation.  ROSC obtained 16:26.  16:41 patient went to PEA arrest, see code blue documentation.  ROSC obtained at 16:46.  16:51 Epinephrine drip started. 17:25 PEA arrest, see code blue documentation.  ROSC obtained at 17:27. 17:30 Epinephrine drip at 1545m

## 2020-11-22 NOTE — Anesthesia Procedure Notes (Signed)
Procedure Name: Intubation Date/Time: 11/22/2020 1:23 PM Performed by: Daryel Gerald, RN Pre-anesthesia Checklist: Patient identified, Patient being monitored, Timeout performed, Emergency Drugs available and Suction available Patient Re-evaluated:Patient Re-evaluated prior to induction Oxygen Delivery Method: Circle system utilized Preoxygenation: Pre-oxygenation with 100% oxygen Induction Type: IV induction Ventilation: Mask ventilation without difficulty Laryngoscope Size: 3 and McGraph Grade View: Grade I Tube type: Oral Tube size: 7.0 mm Number of attempts: 1 Airway Equipment and Method: Stylet Placement Confirmation: ETT inserted through vocal cords under direct vision, positive ETCO2 and breath sounds checked- equal and bilateral Secured at: 20 cm Tube secured with: Tape Dental Injury: Teeth and Oropharynx as per pre-operative assessment

## 2020-11-22 NOTE — Progress Notes (Signed)
  GOALS OF CARE DISCUSSION  The Clinical status was relayed to family in detail.  Updated and notified of patients medical condition.    PATIENT WITH 5 EPISODES OF CARDIAC ARREST EACH TIME LASTING ABOUT 3-5 MINS  PATIENT ON FULL VENT SUPPORT AND MAX DOSE OF EPINEPHRINE PATIENT NOW BLEEDING FROM LUNGS AND GI TRACT.  I HAVE ENCOURAGED FAMILY TO MAKE DNR  SOME FAMILY MEMBERS WITNESSED CPR  I HAVE RELAYED TO THEM THAT PATIENT IS SUFFERING  Additional CC time 22 mins  Sakari Alkhatib Patricia Pesa, M.D.  [facility]  Medical Director [place] Director [facility] Cardio-Pulmonary Department

## 2020-11-22 NOTE — Progress Notes (Signed)
Pt's ETTwas advanced 2 cm and is now 25 at the lip.

## 2020-11-22 NOTE — H&P (Signed)
$'@LOGO'Z$ @   MRN : [id number]  Louis Huynh is a 60 y.o. (23-Aug-1960) male who presents with chief complaint of need av access.  History of Present Illness:   Patient presents to [facility] today for creation of dialysis access.  He was last seen in the office approximately 3 months ago and declined surgery at that time.   He dialyzes Mon, Wed and Friday   Current access is via a catheter which is functioning well. This is his initial catheter and was placed April 11, 2020. There have not been any episodes of catheter infection.  He denies any interval hospitalizations or illnesses.  There have been no significant changes in his overall health status.  He denies any interval COVID infections.   The patient denies amaurosis fugax or recent TIA symptoms. There are no recent neurological changes noted.   The patient denies claudication symptoms or rest pain symptoms. The patient denies history of DVT, PE or superficial thrombophlebitis. The patient denies recent episodes of angina or shortness of breath.    Current Meds  Medication Sig   carvedilol (COREG) 25 MG tablet Take 0.5 tablets (12.5 mg total) by mouth 2 (two) times daily.   hydrALAZINE (APRESOLINE) 50 MG tablet Take 50 mg by mouth 3 (three) times daily.   isosorbide dinitrate (ISORDIL) 20 MG tablet Take 20 mg by mouth daily.   losartan (COZAAR) 100 MG tablet Take 100 mg by mouth daily.   Multiple Vitamin (MULTIVITAMIN) capsule Take 1 capsule by mouth at bedtime.   sevelamer carbonate (RENVELA) 800 MG tablet Take 1,600 mg by mouth 3 (three) times daily with meals.    Past Medical History:  Diagnosis Date   Anemia    Atrial flutter (HCC)    CAD (coronary artery disease)    CHF (congestive heart failure) (HCC)    Chronic anticoagulation    Apixaban   Coronary artery disease    DCM (dilated cardiomyopathy) (HCC)    Elevated PSA    Erectile dysfunction    ESRD (end stage renal disease) (HCC)     Headache    HLD (hyperlipidemia)    Hypertension    OSA on CPAP    PAH (pulmonary artery hypertension) (HCC)    PTSD (post-traumatic stress disorder)    Symptomatic bradycardia     Past Surgical History:  Procedure Laterality Date   RIGHT HEART CATH Right 10/25/2019   Location: [facility]; Surgeon: Golden Circle, MD   RIGHT HEART CATH Right 01/21/2020   Location: [facility]; Surgeon: Pura Spice, MD    Social History Social History   Tobacco Use   Smoking status: Never   Smokeless tobacco: Never  Substance Use Topics   Alcohol use: Yes    Comment: occasionally   Drug use: Not Currently    Family History Family History  Problem Relation Age of Onset   Hypertension Mother    Hypertension Sister    Hypertension Brother    Heart attack Brother    Heart failure Brother     Allergies  Allergen Reactions   Ace Inhibitors Hives     REVIEW OF SYSTEMS (Negative unless checked)  Constitutional: '[]'$ Weight loss  '[]'$ Fever  '[]'$ Chills Cardiac: '[]'$ Chest pain   '[]'$ Chest pressure   '[]'$ Palpitations   '[]'$ Shortness of breath when laying flat   '[]'$ Shortness of breath with exertion. Vascular:  '[]'$ Pain in legs with walking   '[]'$ Pain in legs at rest  '[]'$ History of DVT   '[]'$ Phlebitis   '[]'$ Swelling in legs   '[]'$   Varicose veins   '[]'$ Non-healing ulcers Pulmonary:   '[]'$ Uses home oxygen   '[]'$ Productive cough   '[]'$ Hemoptysis   '[]'$ Wheeze  '[]'$ COPD   '[]'$ Asthma Neurologic:  '[]'$ Dizziness   '[]'$ Seizures   '[]'$ History of stroke   '[]'$ History of TIA  '[]'$ Aphasia   '[]'$ Vissual changes   '[]'$ Weakness or numbness in arm   '[]'$ Weakness or numbness in leg Musculoskeletal:   '[]'$ Joint swelling   '[]'$ Joint pain   '[]'$ Low back pain Hematologic:  '[]'$ Easy bruising  '[]'$ Easy bleeding   '[]'$ Hypercoagulable state   '[]'$ Anemic Gastrointestinal:  '[]'$ Diarrhea   '[]'$ Vomiting  '[]'$ Gastroesophageal reflux/heartburn   '[]'$ Difficulty swallowing. Genitourinary:  '[x]'$ Chronic kidney disease   '[]'$ Difficult urination  '[]'$ Frequent urination   '[]'$ Blood in urine Skin:  '[]'$ Rashes   '[]'$ Ulcers   Psychological:  '[]'$ History of anxiety   '[]'$  History of major depression.  Physical Examination  Vitals:   11/22/20 1131  BP: (!) 128/110  Pulse: 97  Resp: 12  Temp: 97.8 F (36.6 C)  TempSrc: Oral  SpO2: 99%  Weight: 101.2 kg  Height: '5\' 8"'$  (1.727 m)   Body mass index is 33.92 kg/m. Gen: WD/WN, NAD Head: [place]/AT, No temporalis wasting.  Ear/Nose/Throat: Hearing grossly intact, nares w/o erythema or drainage Eyes: PER, EOMI, sclera nonicteric.  Neck: Supple, no gross masses or lesions.  No JVD.  Pulmonary:  Good air movement, no audible wheezing, no use of accessory muscles.  Cardiac: RRR, precordium non-hyperdynamic. Vascular:    Right IJ catheter clean dry and intact Vessel Right Left  Radial Palpable Palpable  Brachial Palpable Palpable  Gastrointestinal: soft, non-distended. No guarding/no peritoneal signs.  Musculoskeletal: M/S 5/5 throughout.  No deformity.  Neurologic: CN 2-12 intact. Pain and light touch intact in extremities.  Symmetrical.  Speech is fluent. Motor exam as listed above. Psychiatric: Judgment intact, Mood & affect appropriate for pt's clinical situation. Dermatologic: No rashes or ulcers noted.  No changes consistent with cellulitis.   CBC Lab Results  Component Value Date   WBC 6.0 11/01/2020   HGB 14.5 11/01/2020   HCT 42.3 11/01/2020   MCV 97.0 11/01/2020   PLT 153 11/01/2020    BMET    Component Value Date/Time   NA 138 11/01/2020 1425   K 5.0 11/22/2020 1139   CL 96 (L) 11/01/2020 1425   CO2 31 11/01/2020 1425   GLUCOSE 116 (H) 11/01/2020 1425   BUN 33 (H) 11/01/2020 1425   CREATININE 5.39 (H) 11/01/2020 1425   CALCIUM 9.0 11/01/2020 1425   GFRNONAA 12 (L) 11/01/2020 1425   GFRAA 27 (L) 11/30/2018 1438   Estimated Creatinine Clearance: 17.2 mL/min (A) (by C-G formula based on SCr of 5.39 mg/dL (H)).  COAG No results found for: INR, PROTIME  Radiology No results found.   Assessment/Plan 1. End stage renal disease  ([place]) Recommend:   At this time the patient does not have appropriate extremity access for dialysis.  His catheter has been functioning adequately.  Previous vein mapping in the office demonstrated an adequate cephalic vein on the left for fistula creation and there have not been any interval major changes in his health care.  Although it has been nearly 3 months since last seen by as the patient wished to consider what to do next.  I believe the original plan remains intact.   Patient should have a left brachial cephalic fistula created.   The risks, benefits and alternative therapies were reviewed in detail with the patient.  All questions were answered.  The patient agrees to proceed with  surgery.     2. Coronary artery disease of native artery of native heart with stable angina pectoris ([place]) Continue cardiac and antihypertensive medications as already ordered and reviewed, no changes at this time.   Continue statin as ordered and reviewed, no changes at this time   Nitrates PRN for chest pain    3. Essential hypertension Continue antihypertensive medications as already ordered, these medications have been reviewed and there are no changes at this time.    Hortencia Pilar, MD  11/22/2020 12:56 PM

## 2020-11-22 NOTE — Transfer of Care (Signed)
Immediate Anesthesia Transfer of Care Note  Patient: Louis Huynh  Procedure(s) Performed: ARTERIOVENOUS (AV) FISTULA CREATION ( BRACHIAL CEPHALIC) (Left: Arm Upper)  Patient Location: ICU  Anesthesia Type:General  Level of Consciousness: obtunded and Patient remains intubated per anesthesia plan  Airway & Oxygen Therapy: Patient remains intubated per anesthesia plan  Post-op Assessment: Report given to RN  Post vital signs: Reviewed and unstable  Last Vitals:  Vitals Value Taken Time  BP 219/171 11/22/20 1635  Temp    Pulse 110 11/22/20 1635  Resp 18 11/22/20 1635  SpO2 98 % 11/22/20 1635    Last Pain:  Vitals:   11/22/20 1131  TempSrc: Oral  PainSc: 0-No pain         Complications: No notable events documented.

## 2020-11-22 NOTE — Consult Note (Signed)
[name] Consultation Note  Patient ID: Louis Huynh, MRN: [id number], DOB/AGE: [birth year] 60 y.o. Admit date: 11/22/2020   Date of Consult: 11/22/2020 Primary Physician: [facility] [place] Primary Cardiologist: Jarrett Soho  Chief Complaint: No chief complaint on file.  Reason for Consult:  Cardiomyopathy  HPI: 60 y.o. male with known severe nonischemic dilated cardiomyopathy with dilation of the left ventricle and ejection fraction of 20 to 30% by echocardiogram in the recent past.  The patient does have a family history of cardiomyopathy with 2 other family members with the same issue who have already died.  Additionally the patient was on appropriate medication management for his treatment including isosorbide hydralazine and beta-blocker.  The patient was on dialysis due to kidney failure and had been relatively stable.  In addition to that the patient was being evaluated for the possibility of ICD placement due to his dilated cardiomyopathy but has never had any episodes of ventricular tachycardia.  He has had a recent episode of syncope which was concerning and this was evaluated but no ventricular tachycardia was apparently found.  He did have episodes of atrial flutter with rapid ventricular rate potentially as a cause of his episodes and was evaluated for the possibility of atrial flutter ablation.  Since then the patient has had a fistula thrombosis for which the fistula was treated in the OR.  During his OR stay the patient had some hypotension multifactorial in nature including his medication management listed above as well as possible cardiomyopathy with additional episode of tachycardia with rhythm suggesting atrial flutter with rapid ventricular rate.  At that time it is assumed that the patient may have had more acidosis and hypotension under those conditions and required ACLS treatment.  During his ACLS treatment he received several rounds of  compressions, bicarbonate, and epinephrine.  The patient then had a long run of wide-complex tachycardia consistent with ventricular tachycardia and was given intravenous bolus of amiodarone.  Since then he has had a reasonable heart rate at 100 bpm in normal sinus rhythm but continued hypotension.  The patient has very poor potential outcome after above but currently is slightly more stable  Past Medical History:  Diagnosis Date  . Anemia   . Atrial flutter ([place])   . CAD (coronary artery disease)   . CHF (congestive heart failure) ([place])   . Chronic anticoagulation    Apixaban  . Coronary artery disease   . DCM (dilated cardiomyopathy) ([place])   . Elevated PSA   . Erectile dysfunction   . ESRD (end stage renal disease) ([place])   . Headache   . HLD (hyperlipidemia)   . Hypertension   . OSA on CPAP   . PAH (pulmonary artery hypertension) ([place])   . PTSD (post-traumatic stress disorder)   . Symptomatic bradycardia       Surgical History:  Past Surgical History:  Procedure Laterality Date  . RIGHT HEART CATH Right 10/25/2019   Location: [facility]; Surgeon: Golden Circle, MD  . RIGHT HEART CATH Right 01/21/2020   Location: [facility]; Surgeon: Pura Spice, MD     Home Meds: Prior to Admission medications   Medication Sig Start Date End Date Taking? Authorizing Provider  carvedilol (COREG) 25 MG tablet Take 0.5 tablets (12.5 mg total) by mouth 2 (two) times daily. 09/02/20  Yes Jennye Boroughs, MD  hydrALAZINE (APRESOLINE) 50 MG tablet Take 50 mg by mouth 3 (three) times daily.   Yes [provider]  HYDROcodone-acetaminophen (NORCO) 5-325 MG tablet  Take 1-2 tablets by mouth every 6 (six) hours as needed for severe pain or moderate pain. 11/22/20  Yes Schnier, Dolores Lory, MD  isosorbide dinitrate (ISORDIL) 20 MG tablet Take 20 mg by mouth daily.   Yes [provider]  losartan (COZAAR) 100 MG tablet Take 100 mg by mouth daily.   Yes [provider]  Multiple Vitamin  (MULTIVITAMIN) capsule Take 1 capsule by mouth at bedtime.   Yes [provider]  sevelamer carbonate (RENVELA) 800 MG tablet Take 1,600 mg by mouth 3 (three) times daily with meals.   Yes [provider]  apixaban (ELIQUIS) 5 MG TABS tablet Take 5 mg by mouth 2 (two) times daily.    [provider]    Inpatient Medications:  . chlorhexidine      . [MAR Hold] docusate  100 mg Per Tube BID  . famotidine      . [MAR Hold] famotidine (PEPCID) IV  20 mg Intravenous Q12H  . [MAR Hold] fentaNYL (SUBLIMAZE) injection  50 mcg Intravenous Once  . [MAR Hold] heparin  5,000 Units Subcutaneous Q8H  . [MAR Hold] polyethylene glycol  17 g Per Tube Daily  . sodium bicarbonate      . sodium bicarbonate      . [MAR Hold] sodium chloride flush  3 mL Intravenous Q12H   . [MAR Hold] sodium chloride    . amiodarone    . amiodarone    . fentaNYL infusion INTRAVENOUS    . sodium bicarbonate 150 mEq in D5W infusion      Allergies:  Allergies  Allergen Reactions  . Ace Inhibitors Hives    Social History   Socioeconomic History  . Marital status: Married    Spouse name: Not on file  . Number of children: Not on file  . Years of education: Not on file  . Highest education level: Not on file  Occupational History  . Not on file  Tobacco Use  . Smoking status: Never  . Smokeless tobacco: Never  Substance and Sexual Activity  . Alcohol use: Yes    Comment: occasionally  . Drug use: Not Currently  . Sexual activity: Yes  Other Topics Concern  . Not on file  Social History Narrative  . Not on file   Social Determinants of Health   Financial Resource Strain: Not on file  Food Insecurity: Not on file  Transportation Needs: Not on file  Physical Activity: Not on file  Stress: Not on file  Social Connections: Not on file  Intimate Partner Violence: Not on file     Family History  Problem Relation Age of Onset  . Hypertension Mother   . Hypertension Sister    . Hypertension Brother   . Heart attack Brother   . Heart failure Brother      Review of Systems    Labs: No results for input(s): CKTOTAL, CKMB, TROPONINI in the last 72 hours. Lab Results  Component Value Date   WBC 6.0 11/01/2020   HGB 14.5 11/01/2020   HCT 42.3 11/01/2020   MCV 97.0 11/01/2020   PLT 153 11/01/2020    Recent Labs  Lab 11/22/20 1139  K 5.0   No results found for: CHOL, HDL, LDLCALC, TRIG No results found for: DDIMER  Radiology/Studies:  No results found.     Weights: Filed Weights   11/22/20 1131  Weight: 101.2 kg     Physical Exam: Blood pressure (!) 128/110, pulse 97, temperature 97.8 F (36.6  C), temperature source Oral, resp. rate 12, height '5\' 8"'$  (1.727 m), weight 101.2 kg, SpO2 99 %. Body mass index is 33.92 kg/m. General: Well developed, well nourished,  Head eyes ears nose throat: Normocephalic, atraumatic, sclera non-icteric, no xanthomas, nares are without discharge. No apparent thyromegaly and/or mass  Lungs: Intubated    Heart: Rapid rate   Abdomen: Soft,  d istended w  Extremities: N trace edema.   Peripheral : 2+ bilateral upper extremity pulses, 2+ bilateral femoral pulses, 2+ bilateral dorsal pedal pulse Neuro: Not alert and oriented. No facial asymmetry. No focal deficit. Moves all extremities spontaneously.     Assessment: 60 year old male with nonischemic dilated cardiomyopathy with kidney failure previously on appropriate medication management with episodes of syncope and atrial flutter now with hypotension atrial flutter and an event of ventricular tachycardia likely secondary to poor cardiac output during surgical intervention of fistula now under ACLS protocol with poor potential outcome  Plan: 1.  Continuation of ACLS protocol as able for outcome although currently poor prognosis due to significant circumstances as listed above 2.  Amiodarone bolus given and amiodarone drip started for ventricular tachycardia and  also potential treatment for atrial flutter with rapid rate 3.  Pressors and supportive care as able with poor prognosis due to all above 4.  No cardiac intervention due to nonischemic cardiomyopathy  Signed, Corey Skains M.D. [facility] 11/22/2020, 4:18 PM

## 2020-11-22 NOTE — Interval H&P Note (Signed)
History and Physical Interval Note:  11/22/2020 1:01 PM  Louis Huynh  has presented today for surgery, with the diagnosis of ESRD.  The various methods of treatment have been discussed with the patient and family. After consideration of risks, benefits and other options for treatment, the patient has consented to  Procedure(s): ARTERIOVENOUS (AV) FISTULA CREATION ( BRACHIAL CEPHALIC) (Left) as a surgical intervention.  The patient's history has been reviewed, patient examined, no change in status, stable for surgery.  I have reviewed the patient's chart and labs.  Questions were answered to the patient's satisfaction.     Hortencia Pilar

## 2020-11-22 NOTE — Progress Notes (Signed)
CBG 23 left hand and CBG 39 on right hand. 1 ampule of D50 administered

## 2020-11-22 NOTE — Progress Notes (Signed)
17:55 blood pressure low norepinephrine started.  18:34  levophed increased to 21mg.  Dr. KMortimer Friesnotified and ordered drip to be increase by 126m until blood pressure stable or until levophed was at 10030mmin.

## 2020-11-22 NOTE — Progress Notes (Signed)
GOALS OF CARE DISCUSSION  The Clinical status was relayed to family in detail. Wife and Sister at Bedside Updated and notified of patients medical condition.    Patient remains unresponsive and will not open eyes to command.   Patient is having a weak cough and struggling to remove secretions.   Patient with increased WOB and using accessory muscles to breathe Explained to family course of therapy and the modalities  Multiple cardiac arrests with severe cardiogenic shock  Patient with Progressive multiorgan failure with a very high probablity of a very minimal chance of meaningful recovery despite all aggressive and optimal medical therapy.  PATIENT REMAINS FULL CODE   Additional CC time 25 mins   Fatisha Rabalais Patricia Pesa, M.D.  [facility]  Medical Director [place] Director [facility] Cardio-Pulmonary Department

## 2020-11-22 NOTE — Anesthesia Preprocedure Evaluation (Signed)
Anesthesia Evaluation  Patient identified by MRN, date of birth, ID band Patient awake    Reviewed: Allergy & Precautions, NPO status , Patient's Chart, lab work & pertinent test results  Airway Mallampati: III  TM Distance: >3 FB Neck ROM: full    Dental  (+) Chipped   Pulmonary neg pulmonary ROS, sleep apnea and Continuous Positive Airway Pressure Ventilation ,    Pulmonary exam normal        Cardiovascular hypertension, + CAD and +CHF  negative cardio ROS Normal cardiovascular exam     Neuro/Psych  Headaches, PSYCHIATRIC DISORDERS Anxiety negative neurological ROS  negative psych ROS   GI/Hepatic negative GI ROS, Neg liver ROS,   Endo/Other  negative endocrine ROS  Renal/GU Renal InsufficiencyRenal disease     Musculoskeletal   Abdominal (+) + obese,   Peds  Hematology negative hematology ROS (+) Blood dyscrasia, anemia ,   Anesthesia Other Findings Past Medical History: No date: Anemia No date: Atrial flutter (HCC) No date: CAD (coronary artery disease) No date: CHF (congestive heart failure) (HCC) No date: Chronic anticoagulation     Comment:  Apixaban No date: Coronary artery disease No date: DCM (dilated cardiomyopathy) (HCC) No date: Elevated PSA No date: Erectile dysfunction No date: ESRD (end stage renal disease) (HCC) No date: Headache No date: HLD (hyperlipidemia) No date: Hypertension No date: OSA on CPAP No date: PAH (pulmonary artery hypertension) (HCC) No date: PTSD (post-traumatic stress disorder) No date: Symptomatic bradycardia  Past Surgical History: 10/25/2019: RIGHT HEART CATH; Right     Comment:  Location: [facility]; Surgeon: Golden Circle, MD 01/21/2020: RIGHT HEART CATH; Right     Comment:  Location: [facility]; Surgeon: Pura Spice, MD  BMI    Body Mass Index: 33.92 kg/m      Reproductive/Obstetrics negative OB ROS                             Anesthesia  Physical Anesthesia Plan  ASA: 3  Anesthesia Plan: General ETT   Post-op Pain Management:    Induction:   PONV Risk Score and Plan: Ondansetron, Dexamethasone, Midazolam and Treatment may vary due to age or medical condition  Airway Management Planned:   Additional Equipment:   Intra-op Plan:   Post-operative Plan:   Informed Consent: I have reviewed the patients History and Physical, chart, labs and discussed the procedure including the risks, benefits and alternatives for the proposed anesthesia with the patient or authorized representative who has indicated his/her understanding and acceptance.     Dental Advisory Given  Plan Discussed with: Anesthesiologist, CRNA and Surgeon  Anesthesia Plan Comments:         Anesthesia Quick Evaluation

## 2020-11-22 NOTE — Op Note (Signed)
     OPERATIVE NOTE   PROCEDURE: left brachial cephalic arteriovenous fistula placement  PRE-OPERATIVE DIAGNOSIS: End Stage Renal Disease  POST-OPERATIVE DIAGNOSIS: End Stage Renal Disease  SURGEON: Hortencia Pilar  ASSISTANT(S): Ms. Hezzie Bump  ANESTHESIA: general  ESTIMATED BLOOD LOSS: <50 cc  FINDING(S): 3.5 mm cephalic vein  SPECIMEN(S):  none  INDICATIONS:   Louis Huynh is a 60 y.o. male who presents with end stage renal disease.  The patient is scheduled for left brachiocephalic arteriovenous fistula placement.  The patient is aware the risks include but are not limited to: bleeding, infection, steal syndrome, nerve damage, ischemic monomelic neuropathy, failure to mature, and need for additional procedures.  The patient is aware of the risks of the procedure and elects to proceed forward.  DESCRIPTION: After full informed written consent was obtained from the patient, the patient was brought back to the operating room and placed supine upon the operating table.  Prior to induction, the patient received IV antibiotics.   After obtaining adequate anesthesia, the patient was then prepped and draped in the standard fashion for a left arm access procedure.   A first assistant was required to provide a safe and appropriate environment for executing the surgery.  The assistant was integral in providing retraction, exposure, running suture providing suction and in the closing process.   A curvilinear incision was then created midway between the radial impulse and the cephalic vein. The cephalic vein was then identified and dissected circumferentially. It was marked with a surgical marker.    Attention was then turned to the brachial artery which was exposed through the same incision and looped proximally and distally. Side branches were controlled with 4-0 silk ties.  The distal segment of the vein was ligated with a  2-0 silk, and the vein was transected.  The  proximal segment was interrogated with serial dilators.  The vein accepted up to a 3.5 mm dilator without any difficulty. Heparinized saline was infused into the vein and clamped it with a small bulldog.  At this point, I reset my exposure of the brachial artery and controlled the artery with vessel loops proximally and distally.  An arteriotomy was then made with a #11 blade, and extended with a Potts scissor.  Heparinized saline was injected proximal and distal into the radial artery.  The vein was then approximated to the artery while the artery was in its native bed and subsequently the vein was beveled using Potts scissors. The vein was then sewn to the artery in an end-to-side configuration with a running stitch of 6-0 Prolene.  Prior to completing this anastomosis Flushing maneuvers were performed and the artery was allowed to forward and back bleed.  There was no evidence of clot from any vessels.  I completed the anastomosis in the usual fashion and then released all vessel loops and clamps.    There was good  thrill in the venous outflow, and there was 1+ palpable radial pulse.  At this point, I irrigated out the surgical wound.  There was no further active bleeding.  The subcutaneous tissue was reapproximated with a running stitch of 3-0 Vicryl.  The skin was then reapproximated with staples.  The skin was then cleaned, dried, and a honeycomb dressing was placed. [place] The patient tolerated this procedure well.   COMPLICATIONS: None  CONDITION: Margaretmary Dys [facility]  Office: [phone]   11/22/2020, 3:55 PM

## 2020-11-22 NOTE — Progress Notes (Signed)
Unable to obtain oral or axillary temperature.  Patient to unstable to role for rectal at this time.  Dr. Mortimer Fries notified. No further orders.

## 2020-11-22 NOTE — H&P (Addendum)
NAME:  Louis Huynh, MRN:  [id number], DOB:  1960-12-06, LOS: 0 ADMISSION DATE:  11/22/2020  CHIEF COMPLAINT:  respiratory failure    History of Present Illness:  60 y.o. with ESRD male s/p AVF placement by vascular surgery Patient underwent general anesthesia and during procedure, there was evidence of probable MI with severe cardiogenic shock  Patient transferred to ICU post op Patient with severe resp failure post op resp failure  Upon arrival to ICU patient had sudden cardiac arrest    Patient with severe multiorgan failure Significant Hospital Events: Including procedures, antibiotic start and stop dates in addition to other pertinent events   9/14 post op resp failure, cardiogenic shock 9/14 s/p CODE BLUE x 3    Objective   Blood pressure (!) 128/110, pulse 97, temperature 97.8 F (36.6 C), temperature source Oral, resp. rate 12, height 5' 8"  (1.727 m), weight 101.2 kg, SpO2 99 %.        Intake/Output Summary (Last 24 hours) at 11/22/2020 1552 Last data filed at 11/22/2020 1527 Gross per 24 hour  Intake 1500 ml  Output --  Net 1500 ml   Filed Weights   11/22/20 1131  Weight: 101.2 kg      REVIEW OF SYSTEMS  PATIENT IS UNABLE TO PROVIDE COMPLETE REVIEW OF SYSTEMS DUE TO SEVERE CRITICAL ILLNESS AND TOXIC METABOLIC ENCEPHALOPATHY   PHYSICAL EXAMINATION:  GENERAL:critically ill appearing, +resp distress EYES: Pupils equal, round, reactive to light.  No scleral icterus.  MOUTH: Moist mucosal membrane. INTUBATED NECK: Supple.  PULMONARY: +rhonchi, +wheezing CARDIOVASCULAR: S1 and S2.  No murmurs  GASTROINTESTINAL: Soft, nontender, -distended. Positive bowel sounds.  MUSCULOSKELETAL: No swelling, clubbing, or edema.  NEUROLOGIC: obtunded SKIN:intact,warm,dry     Labs/imaging that I havepersonally reviewed  (right click and "Reselect all SmartList Selections" daily)      ASSESSMENT AND PLAN SYNOPSIS  60 yo with ESRD with severe  cariogenic shock and sudden cardiac arrest due to V-tach/A-flutter with non-ischemic cardiomyopathy leading to acute cardiac systolic failure    Severe ACUTE Hypoxic and Hypercapnic Respiratory Failure -continue Mechanical Ventilator support -continue Bronchodilator Therapy -Wean Fio2 and PEEP as tolerated -VAP/VENT bundle implementation -will NOT perform SAT/SBT when respiratory parameters are met    CARDIAC FAILURE-acute systolic dysfunction EF 50% Severe cardiogenic shock Pressors as needed   CARDIAC ICU monitoring  ESRD  -continue Foley Catheter-assess need -Avoid nephrotoxic agents -Follow urine output, BMP -Ensure adequate renal perfusion, optimize oxygenation -Renal dose medications   Intake/Output Summary (Last 24 hours) at 11/22/2020 1552 Last data filed at 11/22/2020 1527 Gross per 24 hour  Intake 1500 ml  Output --  Net 1500 ml     NEUROLOGY Acute toxic metabolic encephalopathy  ENDO - ICU hypoglycemic\Hyperglycemia protocol -check FSBS per protocol   ELECTROLYTES -follow labs as needed -replace as needed -pharmacy consultation and following    Code Status:  FULL Disposition:ICU  Labs   CBC: No results for input(s): WBC, NEUTROABS, HGB, HCT, MCV, PLT in the last 168 hours.  Basic Metabolic Panel: Recent Labs  Lab 11/22/20 1139  K 5.0   GFR: CrCl cannot be calculated (Patient's most recent lab result is older than the maximum 21 days allowed.). No results for input(s): PROCALCITON, WBC, LATICACIDVEN in the last 168 hours.  Liver Function Tests: No results for input(s): AST, ALT, ALKPHOS, BILITOT, PROT, ALBUMIN in the last 168 hours. No results for input(s): LIPASE, AMYLASE in the last 168 hours. No results for input(s): AMMONIA in the last 168  hours.  ABG No results found for: PHART, PCO2ART, PO2ART, HCO3, TCO2, ACIDBASEDEF, O2SAT   Coagulation Profile: No results for input(s): INR, PROTIME in the last 168 hours.  Cardiac  Enzymes: No results for input(s): CKTOTAL, CKMB, CKMBINDEX, TROPONINI in the last 168 hours.  HbA1C: No results found for: HGBA1C  CBG: No results for input(s): GLUCAP in the last 168 hours.   Past Medical History:  He,  has a past medical history of Anemia, Atrial flutter ([place]), CAD (coronary artery disease), CHF (congestive heart failure) ([place]), Chronic anticoagulation, Coronary artery disease, DCM (dilated cardiomyopathy) ([place]), Elevated PSA, Erectile dysfunction, ESRD (end stage renal disease) ([place]), Headache, HLD (hyperlipidemia), Hypertension, OSA on CPAP, PAH (pulmonary artery hypertension) ([place]), PTSD (post-traumatic stress disorder), and Symptomatic bradycardia.   Surgical History:   Past Surgical History:  Procedure Laterality Date   RIGHT HEART CATH Right 10/25/2019   Location: [facility]; Surgeon: Golden Circle, MD   RIGHT HEART CATH Right 01/21/2020   Location: [facility]; Surgeon: Pura Spice, MD     Social History:   reports that he has never smoked. He has never used smokeless tobacco. He reports current alcohol use. He reports that he does not currently use drugs.   Family History:  His family history includes Heart attack in his brother; Heart failure in his brother; Hypertension in his brother, mother, and sister.   Allergies Allergies  Allergen Reactions   Ace Inhibitors Hives     Home Medications  Prior to Admission medications   Medication Sig Start Date End Date Taking? Authorizing Provider  carvedilol (COREG) 25 MG tablet Take 0.5 tablets (12.5 mg total) by mouth 2 (two) times daily. 09/02/20  Yes Jennye Boroughs, MD  hydrALAZINE (APRESOLINE) 50 MG tablet Take 50 mg by mouth 3 (three) times daily.   Yes [provider]  HYDROcodone-acetaminophen (NORCO) 5-325 MG tablet Take 1-2 tablets by mouth every 6 (six) hours as needed for severe pain or moderate pain. 11/22/20  Yes Schnier, Dolores Lory, MD  isosorbide dinitrate (ISORDIL) 20 MG tablet Take 20 mg by mouth  daily.   Yes [provider]  losartan (COZAAR) 100 MG tablet Take 100 mg by mouth daily.   Yes [provider]  Multiple Vitamin (MULTIVITAMIN) capsule Take 1 capsule by mouth at bedtime.   Yes [provider]  sevelamer carbonate (RENVELA) 800 MG tablet Take 1,600 mg by mouth 3 (three) times daily with meals.   Yes [provider]  apixaban (ELIQUIS) 5 MG TABS tablet Take 5 mg by mouth 2 (two) times daily.    [provider]       DVT/GI PRX  assessed I Assessed the need for Labs I Assessed the need for Foley I Assessed the need for Central Venous Line Family Discussion when available I Assessed the need for Mobilization I made an Assessment of medications to be adjusted accordingly Safety Risk assessment completed  CASE DISCUSSED IN MULTIDISCIPLINARY ROUNDS WITH ICU TEAM     Critical Care Time devoted to patient care services described in this note is 95 minutes.   Critical care was necessary to treat /prevent imminent and life-threatening deterioration.   PATIENT WITH VERY POOR PROGNOSIS I ANTICIPATE PROLONGED ICU LOS  Patient is critically ill. Patient with Multiorgan failure and at high risk for cardiac arrest and death.    Corrin Parker, M.D.  [facility]  Medical Director [place] Director [facility] Cardio-Pulmonary Department

## 2020-11-22 NOTE — Progress Notes (Signed)
Unable to doppler posterior tibial, dorsalis pedis, radial or brachial pulses bilaterally.  Dr. Mortimer Fries aware.  Information acknowledged, no orders obtained.

## 2020-11-22 NOTE — Progress Notes (Signed)
   11/22/20 1640  Clinical Encounter Type  Visited With Patient;Family  Visit Type Initial;Code;Critical Care  Referral From Nurse  Consult/Referral To Chaplain  Stress Factors  Family Stress Factors Health changes;Loss of control;Major life changes   Chaplain responded to page from ICU medical staff. Medical staff requested support for Mr. Couper family. Chaplain met with PT's wife, sister, and brother who was at bedside. PT was actively being worked on by Social research officer, government. Space was made for expression of feelings, faith, and concerns. Chaplain ministered with compassionate presence, hospitality, prayer, and reflective listening. Chaplain also checked on medical staff. Chaplain notified ICU secretary that chaplain is available if further needed   [name], [place].

## 2020-11-23 ENCOUNTER — Other Ambulatory Visit: Payer: Self-pay

## 2020-11-23 ENCOUNTER — Inpatient Hospital Stay: Payer: Federal, State, Local not specified - PPO

## 2020-11-23 ENCOUNTER — Encounter: Payer: Self-pay | Admitting: Vascular Surgery

## 2020-11-23 DIAGNOSIS — Z01818 Encounter for other preprocedural examination: Secondary | ICD-10-CM

## 2020-11-23 LAB — GLUCOSE, CAPILLARY
Glucose-Capillary: 134 mg/dL — ABNORMAL HIGH (ref 70–99)
Glucose-Capillary: 156 mg/dL — ABNORMAL HIGH (ref 70–99)
Glucose-Capillary: 174 mg/dL — ABNORMAL HIGH (ref 70–99)
Glucose-Capillary: 176 mg/dL — ABNORMAL HIGH (ref 70–99)
Glucose-Capillary: 63 mg/dL — ABNORMAL LOW (ref 70–99)

## 2020-11-23 LAB — RENAL FUNCTION PANEL
Albumin: 3.2 g/dL — ABNORMAL LOW (ref 3.5–5.0)
Albumin: 3.2 g/dL — ABNORMAL LOW (ref 3.5–5.0)
Anion gap: 18 — ABNORMAL HIGH (ref 5–15)
Anion gap: 15 (ref 5–15)
BUN: 63 mg/dL — ABNORMAL HIGH (ref 6–20)
BUN: 46 mg/dL — ABNORMAL HIGH (ref 6–20)
CO2: 19 mmol/L — ABNORMAL LOW (ref 22–32)
CO2: 20 mmol/L — ABNORMAL LOW (ref 22–32)
Calcium: 9.2 mg/dL (ref 8.9–10.3)
Calcium: 9.5 mg/dL (ref 8.9–10.3)
Chloride: 104 mmol/L (ref 98–111)
Chloride: 102 mmol/L (ref 98–111)
Creatinine, Ser: 7.01 mg/dL — ABNORMAL HIGH (ref 0.61–1.24)
Creatinine, Ser: 5.25 mg/dL — ABNORMAL HIGH (ref 0.61–1.24)
GFR, Estimated: 8 mL/min — ABNORMAL LOW (ref >60)
GFR, Estimated: 12 mL/min — ABNORMAL LOW (ref >60)
Glucose, Bld: 181 mg/dL — ABNORMAL HIGH (ref 70–99)
Glucose, Bld: 142 mg/dL — ABNORMAL HIGH (ref 70–99)
Phosphorus: 8.8 mg/dL — ABNORMAL HIGH (ref 2.5–4.6)
Phosphorus: 6.5 mg/dL — ABNORMAL HIGH (ref 2.5–4.6)
Potassium: 5 mmol/L (ref 3.5–5.1)
Potassium: 4.5 mmol/L (ref 3.5–5.1)
Sodium: 141 mmol/L (ref 135–145)
Sodium: 137 mmol/L (ref 135–145)

## 2020-11-23 LAB — BLOOD GAS, ARTERIAL
Acid-base deficit: 6.4 mmol/L — ABNORMAL HIGH (ref 0.0–2.0)
Acid-base deficit: 3.9 mmol/L — ABNORMAL HIGH (ref 0.0–2.0)
Bicarbonate: 19.1 mmol/L — ABNORMAL LOW (ref 20.0–28.0)
Bicarbonate: 20.8 mmol/L (ref 20.0–28.0)
FIO2: 70
FIO2: 0.35
MECHVT: 500 mL
MECHVT: 500 mL
O2 Saturation: 99.8 %
O2 Saturation: 97.6 %
PEEP: 10 cmH2O
PEEP: 5 cmH2O
Patient temperature: 33.8
Patient temperature: 37
RATE: 15 resp/min
RATE: 15 resp/min
pCO2 arterial: 32 mmHg (ref 32.0–48.0)
pCO2 arterial: 36 mmHg (ref 32.0–48.0)
pH, Arterial: 7.37 (ref 7.350–7.450)
pH, Arterial: 7.37 (ref 7.350–7.450)
pO2, Arterial: 221 mmHg — ABNORMAL HIGH (ref 83.0–108.0)
pO2, Arterial: 100 mmHg (ref 83.0–108.0)

## 2020-11-23 LAB — CBC
HCT: 46.9 % (ref 39.0–52.0)
Hemoglobin: 15.2 g/dL (ref 13.0–17.0)
MCH: 31.5 pg (ref 26.0–34.0)
MCHC: 32.4 g/dL (ref 30.0–36.0)
MCV: 97.3 fL (ref 80.0–100.0)
Platelets: 133 10*3/uL — ABNORMAL LOW (ref 150–400)
RBC: 4.82 MIL/uL (ref 4.22–5.81)
RDW: 16.3 % — ABNORMAL HIGH (ref 11.5–15.5)
WBC: 22.3 10*3/uL — ABNORMAL HIGH (ref 4.0–10.5)
nRBC: 0.1 % (ref 0.0–0.2)

## 2020-11-23 LAB — LACTIC ACID, PLASMA: Lactic Acid, Venous: 7.1 mmol/L (ref 0.5–1.9)

## 2020-11-23 LAB — PROCALCITONIN: Procalcitonin: 4.13 ng/mL

## 2020-11-23 LAB — PROTIME-INR
INR: 2.2 — ABNORMAL HIGH (ref 0.8–1.2)
Prothrombin Time: 24.4 s — ABNORMAL HIGH (ref 11.4–15.2)

## 2020-11-23 LAB — MAGNESIUM: Magnesium: 2.1 mg/dL (ref 1.7–2.4)

## 2020-11-23 MED ORDER — PROPOFOL 1000 MG/100ML IV EMUL
5.0000 ug/kg/min | INTRAVENOUS | Status: DC
  Administered 2020-11-23: 40 ug/kg/min via INTRAVENOUS
  Administered 2020-11-23: 25 ug/kg/min via INTRAVENOUS
  Administered 2020-11-23: 40 ug/kg/min via INTRAVENOUS
  Administered 2020-11-23: 25 ug/kg/min via INTRAVENOUS
  Filled 2020-11-23 (×3): qty 100

## 2020-11-23 MED ORDER — NOREPINEPHRINE 16 MG/250ML-% IV SOLN: 0.0000 ug/min | INTRAVENOUS | Status: AC

## 2020-11-23 MED ORDER — DOCUSATE SODIUM 50 MG/5ML PO LIQD: 100.0000 mg | Freq: Every day | ORAL | Status: DC | PRN

## 2020-11-23 MED ORDER — HEPARIN SODIUM (PORCINE) 5000 UNIT/ML IJ SOLN: 5000.0000 [IU] | Freq: Three times a day (TID) | INTRAMUSCULAR | Status: AC

## 2020-11-23 MED ORDER — POLYETHYLENE GLYCOL 3350 17 G PO PACK: 17.0000 g | PACK | Freq: Every day | ORAL | Status: DC | PRN

## 2020-11-23 MED ORDER — VECURONIUM BROMIDE 10 MG IV SOLR
INTRAVENOUS | Status: AC
  Filled 2020-11-23: qty 10

## 2020-11-23 MED ORDER — HYDROCORTISONE SOD SUC (PF) 100 MG IJ SOLR: 100.0000 mg | Freq: Two times a day (BID) | INTRAMUSCULAR | Status: AC

## 2020-11-23 MED ORDER — DEXTROSE 50 % IV SOLN
50.0000 mL | Freq: Once | INTRAVENOUS | Status: AC
  Administered 2020-11-23: 50 mL via INTRAVENOUS

## 2020-11-23 MED ORDER — FAMOTIDINE 20 MG IN NS 100 ML IVPB: 20.0000 mg | INTRAVENOUS | Status: AC

## 2020-11-23 MED ORDER — NOREPINEPHRINE 4 MG/250ML-% IV SOLN
INTRAVENOUS | Status: AC
  Filled 2020-11-23: qty 250

## 2020-11-23 MED ORDER — PROPOFOL 1000 MG/100ML IV EMUL: 5.0000 ug/kg/min | INTRAVENOUS | Status: AC

## 2020-11-23 MED ORDER — DEXTROSE 50 % IV SOLN
INTRAVENOUS | Status: AC
  Filled 2020-11-23: qty 50

## 2020-11-23 MED ORDER — AMIODARONE HCL IN DEXTROSE 360-4.14 MG/200ML-% IV SOLN: 30.0000 mg/h | INTRAVENOUS | Status: AC

## 2020-11-23 MED ORDER — MIDAZOLAM HCL 2 MG/2ML IJ SOLN
INTRAMUSCULAR | Status: AC
  Filled 2020-11-23: qty 4

## 2020-11-23 MED ORDER — ACETAMINOPHEN 325 MG PO TABS: 650.0000 mg | ORAL_TABLET | ORAL | Status: DC | PRN

## 2020-11-23 MED FILL — Medication: Qty: 1 | Status: AC

## 2020-11-23 NOTE — Progress Notes (Signed)
   BRIEF OVERNIGHT PROGRESS REPORT   SUBJECTIVE: Patient with worsening renal function, K+ 6.3 with worsening lactic acid and metabolic acidosis.  OBJECTIVE:  On arrival assessment, he was hypothermic  with blood pressure 113/89 mm Hg and pulse rate 81 beats/min. Patient was able to follow commands, will wiggle toes on command, nods appropriately to orientation questions.  Arterial Blood Gas result:  pO2 193; pCO2 40; pH 7.16;  HCO3 14.3, %O2 Sat 99.4.   ASSESSMENT & PLAN  ESRD on HD MWF Hyperkalemia K+ 6.3 Lactic Acidosis Anion Gap Metabolic Acidosis BUN/Cr 99991111 - Trend lactic acid - Monitor I&O's / urinary output - Follow BMP - Ensure adequate renal perfusion - Sodium Bicarb gtt, Follow ABGs - Avoid nephrotoxic agents as able - Replace electrolytes as indicated - Discussed with on call Nephrologist need for urgent CCRT, CRRT initiated per Nephro recommendations. Will use the current right chest perm, per Vascular if malfunctions ok to place Trialysis catheter in any of the groins.     Rufina Falco, DNP, CCRN, FNP-C, AGACNP-BC Acute Care Nurse Practitioner  [place] [facility] Pager: [phone] [place] at [facility]

## 2020-11-23 NOTE — Progress Notes (Signed)
CRRT successfully initiated at [place].

## 2020-11-23 NOTE — Progress Notes (Signed)
Patient to be transferred to [facility], [place]. Report called to [facility] at [place]. [facility] (ground) to transport patient. Wife at bedside aware of transfer and has patient's only belongings.  Patient currently alert and calm on the ventilator and propofol infusion. Levophed titrated down to 4 mcg/min. Amiodarone infusion continued. Only 17 mL of urine out put this shift. CRRT being continued until [facility] arrives. Bair Hugger and water based temperature blanket used to try and help maintain patient's temperature, but patient in 33 degrees Celsius so far this shift (esophageal but confirmed with attempts at oral temperature which would not read and axillary which at mas showed 94 degrees F).

## 2020-11-23 NOTE — Progress Notes (Signed)
Follow up from previous chaplain, met family members's bedside and waiting area. They absolutely believe he is a miracle and that he will continue to recover.

## 2020-11-23 NOTE — Progress Notes (Addendum)
[facility]  ROUNDING NOTE   Subjective:   [name] was admitted to [facility] on 11/22/2020 for MI (myocardial infarction) [facility]) [I21.9] Myocardial infarction [facility]) [I21.9]  Last hemodialysis treatment was Monday 9/12.   Patient underwent AVF placement by Dr. Delana Meyer. Operation complicated by cardiac arrest with code blue. Patient brought to ICU. Requiring vasopressors. Found to have hyperkalemia and metabolic acidosis. Patient placed on CRRT overnight.   Objective:  Vital signs in last 24 hours:  Temp:  [92.8 F (33.8 C)-97.8 F (36.6 C)] 92.8 F (33.8 C) (09/15 0600) Pulse Rate:  [43-179] 59 (09/15 0805) Resp:  [0-41] 15 (09/15 0805) BP: (65-219)/(30-171) 107/78 (09/15 0800) SpO2:  [8 %-100 %] 99 % (09/15 0805) FiO2 (%):  [50 %-100 %] 50 % (09/15 0525) Weight:  [101.2 kg-106.5 kg] 106.5 kg (09/15 0344)  Weight change:  Filed Weights   11/22/20 1131 11/23/20 0344  Weight: 101.2 kg 106.5 kg    Intake/Output: I/O last 3 completed shifts: In: 3235.4 [I.V.:2635.4; IV Piggyback:600] Out: 975 [Other:975]   Intake/Output this shift:  Total I/O In: 149.6 [I.V.:149.6] Out: 175 [Urine:10; Other:165]  Physical Exam: General: Critically ill  Head: Normocephalic, atraumatic. Moist oral mucosal membranes  Eyes: Anicteric, PERRL  Neck: Supple, trachea midline  Lungs:  Clear to auscultation  Heart: Regular rate and rhythm  Abdomen:  Soft   Extremities:  + peripheral edema.  Neurologic: Nonfocal, moving all four extremities  Skin: No lesions  Access: RIJ permcath    Basic Metabolic Panel: Recent Labs  Lab 11/22/20 1139 11/22/20 1602 11/22/20 2039 11/23/20 0337  NA  --  162* 141 141  K 5.0 5.5* 6.3* 5.0  CL  --  100 104 104  CO2  --  48* 15* 19*  GLUCOSE  --  92 74 181*  BUN  --  67* 67* 63*  CREATININE  --  7.26* 8.64* 7.01*  CALCIUM  --  7.6* 9.8 9.2  MG  --   --  2.3 2.1  PHOS  --   --   --  8.8*    Liver Function Tests: Recent Labs   Lab 11/22/20 1602 11/22/20 2039 11/23/20 0337  AST 19 148*  --   ALT 19 96*  --   ALKPHOS 59 76  --   BILITOT 1.3* 2.6*  --   PROT 5.1* 6.1*  --   ALBUMIN 2.8* 3.1* 3.2*   No results for input(s): LIPASE, AMYLASE in the last 168 hours. No results for input(s): AMMONIA in the last 168 hours.  CBC: Recent Labs  Lab 11/22/20 1602 11/22/20 2039 11/23/20 0337  WBC 7.0 11.0* 22.3*  HGB 13.6 15.9 15.2  HCT 39.9 48.3 46.9  MCV 95.7 100.4* 97.3  PLT 134* 122* 133*    Cardiac Enzymes: No results for input(s): CKTOTAL, CKMB, CKMBINDEX, TROPONINI in the last 168 hours.  BNP: Invalid input(s): POCBNP  CBG: Recent Labs  Lab 11/23/20 0009 11/23/20 0013 11/23/20 0052 11/23/20 0339 11/23/20 0748  GLUCAP 41* 73* 156* 176* 174*    Microbiology: Results for orders placed or performed during the hospital encounter of 11/22/20  MRSA Next Gen by PCR, Nasal     Status: None   Collection Time: 11/22/20  5:12 PM   Specimen: Nasal Mucosa; Nasal Swab  Result Value Ref Range Status   MRSA by PCR Next Gen NOT DETECTED NOT DETECTED Final    Comment: (NOTE) The GeneXpert MRSA Assay (FDA approved for NASAL specimens only), is one  component of a comprehensive MRSA colonization surveillance program. It is not intended to diagnose MRSA infection nor to guide or monitor treatment for MRSA infections. Test performance is not FDA approved in patients less than 75 years old. Performed at [facility], [address] [place], [place] [address]   Resp Panel by RT-PCR (Flu A&B, Covid) Nasopharyngeal Swab     Status: None   Collection Time: 11/22/20  8:21 PM   Specimen: Nasopharyngeal Swab; Nasopharyngeal(NP) swabs in vial transport medium  Result Value Ref Range Status   SARS Coronavirus 2 by RT PCR NEGATIVE NEGATIVE Final    Comment: (NOTE) SARS-CoV-2 target nucleic acids are NOT DETECTED.  The SARS-CoV-2 RNA is generally detectable in upper respiratory specimens during the  acute phase of infection. The lowest concentration of SARS-CoV-2 viral copies this assay can detect is 138 copies/mL. A negative result does not preclude SARS-Cov-2 infection and should not be used as the sole basis for treatment or other patient management decisions. A negative result may occur with  improper specimen collection/handling, submission of specimen other than nasopharyngeal swab, presence of viral mutation(s) within the areas targeted by this assay, and inadequate number of viral copies(<138 copies/mL). A negative result must be combined with clinical observations, patient history, and epidemiological information. The expected result is Negative.  Fact Sheet for Patients:  EntrepreneurPulse.com.au  Fact Sheet for Healthcare Providers:  IncredibleEmployment.be  This test is no t yet approved or cleared by the [place] FDA and  has been authorized for detection and/or diagnosis of SARS-CoV-2 by FDA under an Emergency Use Authorization (EUA). This EUA will remain  in effect (meaning this test can be used) for the duration of the COVID-19 declaration under Section 564(b)(1) of the Act, 21 U.S.C.section 360bbb-3(b)(1), unless the authorization is terminated  or revoked sooner.       Influenza A by PCR NEGATIVE NEGATIVE Final   Influenza B by PCR NEGATIVE NEGATIVE Final    Comment: (NOTE) The Xpert Xpress SARS-CoV-2/FLU/RSV plus assay is intended as an aid in the diagnosis of influenza from Nasopharyngeal swab specimens and should not be used as a sole basis for treatment. Nasal washings and aspirates are unacceptable for Xpert Xpress SARS-CoV-2/FLU/RSV testing.  Fact Sheet for Patients: EntrepreneurPulse.com.au  Fact Sheet for Healthcare Providers: IncredibleEmployment.be  This test is not yet approved or cleared by the [place] FDA and has been authorized for detection and/or  diagnosis of SARS-CoV-2 by FDA under an Emergency Use Authorization (EUA). This EUA will remain in effect (meaning this test can be used) for the duration of the COVID-19 declaration under Section 564(b)(1) of the Act, 21 U.S.C. section 360bbb-3(b)(1), unless the authorization is terminated or revoked.  Performed at [facility], [address] [place],  [address]     Coagulation Studies: Recent Labs    11/23/20 0337  LABPROT 24.4*  INR 2.2*    Urinalysis: No results for input(s): COLORURINE, LABSPEC, PHURINE, GLUCOSEU, HGBUR, BILIRUBINUR, KETONESUR, PROTEINUR, UROBILINOGEN, NITRITE, LEUKOCYTESUR in the last 72 hours.  Invalid input(s): APPERANCEUR    Imaging: DG Abd 1 View  Result Date: 11/22/2020 CLINICAL DATA:  Nasogastric tube placement. EXAM: ABDOMEN - 1 VIEW COMPARISON:  None. FINDINGS: A nasogastric tube is seen with its distal tip overlying the body of the stomach. Its distal side hole sits approximately 3.6 cm distal to the expected region of the gastroesophageal junction. A paucity of bowel gas is noted within the visualized portion of the upper abdomen. IMPRESSION: Nasogastric tube  positioning, as described above. Electronically Signed   By: Virgina Norfolk M.D.   On: 11/22/2020 23:16   DG Chest Port 1 View  Result Date: 11/23/2020 CLINICAL DATA:  Interval change EXAM: PORTABLE CHEST 1 VIEW COMPARISON:  11/22/2020 FINDINGS: Endotracheal tube in good position. Central venous catheter tip in the right atrium unchanged. NG tube in place. Central venous catheter in the proximal SVC unchanged. Cardiac enlargement. Bibasilar atelectasis without pulmonary edema or effusion. No pneumothorax IMPRESSION: Support lines remain in good position Mild bibasilar atelectasis.  Negative for edema. Electronically Signed   By: Franchot Gallo M.D.   On: 11/23/2020 07:59   DG Chest Port 1 View  Result Date: 11/22/2020 CLINICAL DATA:  Intubation EXAM: PORTABLE CHEST 1  VIEW COMPARISON:  Chest x-ray dated September 01, 2020 FINDINGS: Interval intubation with ET tube approximately 5.5 cm from the carina. Placement of left IJ approach central venous line with tip at the confluence of the left brachiocephalic vein and SVC. Enteric tube partially visualized coursing below the diaphragm. Unchanged position of right central venous line. Unchanged cardiomegaly. Mild bilateral heterogeneous opacities, possibly due to atelectasis or edema. No large pleural effusion or pneumothorax. IMPRESSION: Interval intubation with ET tube approximately 5.5 cm from the carina. Placement of left IJ approach central venous line with tip at the confluence of the left brachiocephalic vein and SVC. Mild bilateral heterogeneous opacities, possibly due to atelectasis or edema. Electronically Signed   By: Yetta Glassman M.D.   On: 11/22/2020 16:31     Medications:    sodium chloride     amiodarone 30 mg/hr (11/23/20 0800)   epinephrine Stopped (11/22/20 2221)   fentaNYL infusion INTRAVENOUS Stopped (11/22/20 2355)   norepinephrine (LEVOPHED) Adult infusion 9 mcg/min (11/23/20 0800)   phenylephrine (NEO-SYNEPHRINE) Adult infusion     prismasol BGK 2/2.5 dialysis solution 2,000 mL/hr at 11/23/20 0829   prismasol BGK 2/2.5 replacement solution 500 mL/hr at 11/23/20 0115   prismasol BGK 2/2.5 replacement solution 500 mL/hr at 11/23/20 0115   propofol (DIPRIVAN) infusion 40 mcg/kg/min (11/23/20 0847)   sodium bicarbonate 150 mEq in D5W infusion 100 mL/hr at 11/23/20 0800    chlorhexidine gluconate (MEDLINE KIT)  15 mL Mouth Rinse BID   Chlorhexidine Gluconate Cloth  6 each Topical Q0600   docusate  100 mg Per Tube BID   docusate  100 mg Per Tube BID   [START ON 11/24/2020] famotidine (PEPCID) IV  20 mg Intravenous Q48H   fentaNYL (SUBLIMAZE) injection  50 mcg Intravenous Once   heparin  5,000 Units Subcutaneous Q8H   hydrocortisone sod succinate (SOLU-CORTEF) inj  100 mg Intravenous Q12H   mouth  rinse  15 mL Mouth Rinse 10 times per day   polyethylene glycol  17 g Per Tube Daily   polyethylene glycol  17 g Per Tube Daily   sodium chloride flush  3 mL Intravenous Q12H   sodium chloride, acetaminophen, docusate, fentaNYL, fentaNYL (SUBLIMAZE) injection, fentaNYL (SUBLIMAZE) injection, heparin, ipratropium-albuterol, midazolam, midazolam, midazolam, midazolam, ondansetron (ZOFRAN) IV, polyethylene glycol, sodium chloride, sodium chloride flush  Assessment/ Plan:  [name] is a 60 y.o. black male with end stage renal disease on hemodialysis, hypertension, atrial fibrillation, coronary artery disease, congestive heart failure, hyperlipidemia, sleep apnea, pulmonary hypertension who presented for left AVF placed by Dr. Delana Meyer. Patient underwent cardiac arrest and transferred to ICU.   [facility] MWF [facility] RIJ permcath 99.5kg   End Stage Renal Disease: with hyperkalemia and metabolic acidosis. Hemodynamically unstable requiring  vasopressors. Placed on CRRT.  - Continue CRRT - Keep even - Discontinue bicarb infusion  Hypotension: with cardiogenic shock. Requiring vasopressors:  - norepinephrine.  - stress dose steroids.   Acute respiratory failure: intubated and sedated.   Discussed case with patient's wife and mother-in-law.    LOS: 1 Louis Huynh 9/15/20228:52 AM

## 2020-11-23 NOTE — Progress Notes (Signed)
NAME:  Louis Huynh, MRN:  [id number], DOB:  [birth year], LOS: 1 ADMISSION DATE:  11/22/2020 CHIEF COMPLAINT:  Respiratory failure  BRIEF SYNOPSIS 60 y.o. with ESRD male s/p AVF placement by vascular surgery. Patient underwent general anesthesia and during procedure, there was evidence of probable MI with severe cardiogenic shock. Patient transferred to ICU post op, with severe resp failure post op resp failure. Upon arrival to ICU patient had sudden cardiac arrest status post ACLS noted PEA x 5 on 9/14   Pertinent  Medical History   Past Medical History:  Diagnosis Date   Anemia    Atrial flutter (HCC)    CAD (coronary artery disease)    CHF (congestive heart failure) (HCC)    Chronic anticoagulation    Apixaban   Coronary artery disease    DCM (dilated cardiomyopathy) (HCC)    Elevated PSA    Erectile dysfunction    ESRD (end stage renal disease) ([place])    Headache    HLD (hyperlipidemia)    Hypertension    OSA on CPAP    PAH (pulmonary artery hypertension) (HCC)    PTSD (post-traumatic stress disorder)    Symptomatic bradycardia      Significant Hospital Events: Including procedures, antibiotic start and stop dates in addition to other pertinent events   Patient status post elective left brachial cephalic arteriovenous fistula placement for ESRD on 9/14. Procedure complicated by suspected acute MI during procedure with hemodynamic instability, hypotension and tachycardia with rapid atrial flutter with levophed and phenylephrine gtts started. Admitted to the ICU from [place] on 9/14 intubated for severe respiratory failure and hemodynamic instability with cardiogenic shock, hypothermic. Upon arrival to the ICU, patient had sudden cardiac arrest noted PEA versus wide-complex v-tach status post ACLS x 5 9/14. Noted bleeding from OGT and ETT suspect trauma from ACLS. Amiodarone bolus with gtt started, patient on propofol, fentanyl, levophed, and phenylephrine, and epinephrine  gtts.  9/15: Remains intubated, on propofol, levophed, amiodarone gtts, with Bair hugger warming blanket. Arousable on propofol gtt, opens eyes, following simple commands, not moving extremities. CRRT started early 9/15 for worsening renal function. Family requesting transfer to [facility]; patient with extensive family history of nonischemic dilated CM and family receives care through [facility].   Micro Data:  No cultures Respiratory viral panel COVID, MRSA, and Influenza screening 9/14 negative  Antimicrobials:  None  Interim History / Subjective:  Remains intubated, on propofol, levophed, amiodarone gtts, with Bair hugger warming blanket. Arousable on propofol gtt, opens eyes, following simple commands, not moving extremities. CRRT started early 9/15 for worsening renal function.  Patient unable to provide subjective history due to ventilator support and sedation. Multiple family members intermittently visiting at bedside.   Objective   Blood pressure 92/66, pulse (!) 57, temperature (!) 92.5 F (33.6 C), temperature source Esophageal, resp. rate 17, height 5' 8"  (1.727 m), weight 106.5 kg, SpO2 99 %.    Vent Mode: PRVC FiO2 (%):  [35 %-100 %] 35 % Set Rate:  [15 bmp] 15 bmp Vt Set:  [500 mL] 500 mL PEEP:  [5 cmH20-10 cmH20] 5 cmH20 Plateau Pressure:  [26 cmH20] 26 cmH20   Intake/Output Summary (Last 24 hours) at 11/23/2020 1132 Last data filed at 11/23/2020 1100 Gross per 24 hour  Intake 3568.72 ml  Output 1385 ml  Net 2183.72 ml   Filed Weights   11/22/20 1131 11/23/20 0344  Weight: 101.2 kg 106.5 kg    REVIEW OF SYSTEMS PATIENT IS UNABLE TO PROVIDE  COMPLETE REVIEW OF SYSTEMS DUE TO SEVERE CRITICAL ILLNESS AND TOXIC METABOLIC ENCEPHALOPATHY  PHYSICAL EXAMINATION: GENERAL: critically ill appearing, on CRRT, +Bair warmer EYES: Pupils equal, round, reactive to light.  No scleral icterus.  MOUTH: Moist mucosal membrane. INTUBATED NECK: Supple.  PULMONARY:  +rhonchi CARDIOVASCULAR: S1 and S2.  No murmurs  GASTROINTESTINAL: Soft, nontender, + distended. Positive bowel sounds, hypoactive MUSCULOSKELETAL: No swelling, clubbing. 2+ edema bilat LE NEUROLOGIC:  opens eyes and follows commands SKIN:intact,warm,dry   Labs/imaging that I havepersonally reviewed  (right click and "Reselect all SmartList Selections" daily)  CXR 11/23/20: cardiomegaly, mild scattered infiltrates bilaterally bibasilar atelectasis without s/s pulmonary edema or effusion. ETT noted above the carina.  ASSESSMENT AND PLAN  60 yo male with ESRD s/p AVF placement by vascular surgery 9/14 sudden cardiac arrest due to V-tach/A-flutter with non-ischemic cardiomyopathy leading to acute cardiac systolic failure status post ACLS noted PEA x 5 on 9/14. Now on CRRT for worsening acidosis, sedated on propofol but awake, following simple commands.    Severe ACUTE Hypoxic and Hypercapnic Respiratory Failure - Continue Mechanical Ventilator support - Continue Bronchodilator Therapy - Continue hydrocortisone 155m IV Q12h -Wean Fio2 and PEEP as tolerated -VAP/VENT bundle implementation -will NOT perform SAT/SBT when respiratory parameters are met   Vent Mode: PRVC FiO2 (%):  [35 %-100 %] 35 % Set Rate:  [15 bmp] 15 bmp Vt Set:  [500 mL] 500 mL PEEP:  [5 cmH20-10 cmH20] 5 cmH20 Plateau Pressure:  [26 cmH20] 26 cmH20  CARDIAC FAILURE-acute systolic dysfunction EF 258%Severe cardiogenic shock - Maintain MAP > 65 - Pressors as needed; currently on levophed only   CARDIAC Atrial flutter, s/p v-tach s/p ACLS CODE BLUE x5  - ICU monitoring - Cardiology consult reviewed. Patient with personal and extensive family history of non-ischemic dilated cardiomyopathy, family follows with [facility] and requested transfer. [facility]  - Amiodarone gtt    ESRD  - CRRT started early 9/15, per nephrology recommendations - Continue Foley Catheter-assess  need. Follow urine output - Monitor BMP - Avoid nephrotoxic agents - Renal dose medications  Intake/Output Summary (Last 24 hours) at 11/23/2020 1132 Last data filed at 11/23/2020 1100 Gross per 24 hour  Intake 3568.72 ml  Output 1385 ml  Net 2183.72 ml   NEUROLOGY Acute toxic metabolic encephalopathy - Daily sedation vacation to assess neuro status - Minimize use of sedatives   Leukocytosis, Likely reactive - Monitor CBC qam - Hold off on initiating empiric antibiotics for now - Pan culture and consider antibiotics if worsening WBC or spikes fever > 100.9  ENDO - ICU hypoglycemic\Hyperglycemia protocol -check FSBS per protocol    ELECTROLYTES -follow labs as needed -replace as needed -pharmacy consultation and following   Code Status:  FULL Disposition:ICU [facility] DR NClovis Pu  Best practice (right click and "Reselect all SmartList Selections" daily)  Diet: NPO - bowel rest today, reassess in am 9/16 Pain/Anxiety/Delirium protocol (if indicated): Yes (RASS goal -2) VAP protocol (if indicated): Yes DVT prophylaxis: Subcutaneous Heparin GI prophylaxis: H2B Glucose control:  SSI Yes Central venous access:  Yes, and it is still needed Arterial line:  Yes, and it is still needed Foley:  Yes, and it is still needed Mobility:  bed rest  Code Status:  FULL Disposition:ICU  Labs   CBC: Recent Labs  Lab 11/22/20 1602 11/22/20 2039 11/23/20 0337  WBC 7.0  11.0* 22.3*  HGB 13.6 15.9 15.2  HCT 39.9 48.3 46.9  MCV 95.7 100.4* 97.3  PLT 134* 122* 133*    Basic Metabolic Panel: Recent Labs  Lab 11/22/20 1139 11/22/20 1602 11/22/20 2039 11/23/20 0337  NA  --  162* 141 141  K 5.0 5.5* 6.3* 5.0  CL  --  100 104 104  CO2  --  48* 15* 19*  GLUCOSE  --  92 74 181*  BUN  --  67* 67* 63*  CREATININE  --  7.26* 8.64* 7.01*  CALCIUM  --  7.6* 9.8 9.2  MG  --   --  2.3 2.1   PHOS  --   --   --  8.8*   GFR: Estimated Creatinine Clearance: 13.6 mL/min (A) (by C-G formula based on SCr of 7.01 mg/dL (H)). Recent Labs  Lab 11/22/20 1602 11/22/20 1604 11/22/20 2039 11/22/20 2321 11/23/20 0337  PROCALCITON  --   --  0.83  --  4.13  WBC 7.0  --  11.0*  --  22.3*  LATICACIDVEN  --  5.9* 6.2* 7.1*  --     Liver Function Tests: Recent Labs  Lab 11/22/20 1602 11/22/20 2039 11/23/20 0337  AST 19 148*  --   ALT 19 96*  --   ALKPHOS 59 76  --   BILITOT 1.3* 2.6*  --   PROT 5.1* 6.1*  --   ALBUMIN 2.8* 3.1* 3.2*   No results for input(s): LIPASE, AMYLASE in the last 168 hours. No results for input(s): AMMONIA in the last 168 hours.  ABG    Component Value Date/Time   PHART 7.37 11/23/2020 0509   PCO2ART 32 11/23/2020 0509   PO2ART 221 (H) 11/23/2020 0509   HCO3 19.1 (L) 11/23/2020 0509   ACIDBASEDEF 6.4 (H) 11/23/2020 0509   O2SAT 99.8 11/23/2020 0509     Coagulation Profile: Recent Labs  Lab 11/23/20 0337  INR 2.2*    Cardiac Enzymes: No results for input(s): CKTOTAL, CKMB, CKMBINDEX, TROPONINI in the last 168 hours.  HbA1C: No results found for: HGBA1C  CBG: Recent Labs  Lab 11/23/20 0009 11/23/20 0013 11/23/20 0052 11/23/20 0339 11/23/20 0748  GLUCAP 41* 82* 156* 176* 174*     Past Medical History:  He,  has a past medical history of Anemia, Atrial flutter ([place]), CAD (coronary artery disease), CHF (congestive heart failure) ([place]), Chronic anticoagulation, Coronary artery disease, DCM (dilated cardiomyopathy) ([place]), Elevated PSA, Erectile dysfunction, ESRD (end stage renal disease) ([place]), Headache, HLD (hyperlipidemia), Hypertension, OSA on CPAP, PAH (pulmonary artery hypertension) ([place]), PTSD (post-traumatic stress disorder), and Symptomatic bradycardia.   Surgical History:   Past Surgical History:  Procedure Laterality Date   RIGHT HEART CATH Right 10/25/2019   Location: [facility]; Surgeon: Golden Circle, MD   RIGHT HEART CATH  Right 01/21/2020   Location: [facility]; Surgeon: Pura Spice, MD     Social History:   reports that he has never smoked. He has never used smokeless tobacco. He reports current alcohol use. He reports that he does not currently use drugs.   Family History:  His family history includes Heart attack in his brother; Heart failure in his brother; Hypertension in his brother, mother, and sister.   Allergies Allergies  Allergen Reactions   Ace Inhibitors Hives     Home Medications  Prior to Admission medications   Medication Sig Start Date End Date Taking? Authorizing Provider  carvedilol (COREG) 25 MG tablet Take 0.5 tablets (12.5 mg total) by  mouth 2 (two) times daily. 09/02/20  Yes Jennye Boroughs, MD  hydrALAZINE (APRESOLINE) 50 MG tablet Take 50 mg by mouth 3 (three) times daily.   Yes [provider]  HYDROcodone-acetaminophen (NORCO) 5-325 MG tablet Take 1-2 tablets by mouth every 6 (six) hours as needed for severe pain or moderate pain. 11/22/20  Yes Schnier, Dolores Lory, MD  isosorbide dinitrate (ISORDIL) 20 MG tablet Take 20 mg by mouth daily.   Yes [provider]  losartan (COZAAR) 100 MG tablet Take 100 mg by mouth daily.   Yes [provider]  Multiple Vitamin (MULTIVITAMIN) capsule Take 1 capsule by mouth at bedtime.   Yes [provider]  sevelamer carbonate (RENVELA) 800 MG tablet Take 1,600 mg by mouth 3 (three) times daily with meals.   Yes [provider]  apixaban (ELIQUIS) 5 MG TABS tablet Take 5 mg by mouth 2 (two) times daily.    [provider]       DVT/GI PRX  assessed I Assessed the need for Labs I Assessed the need for Foley I Assessed the need for Central Venous Line Family Discussion when available I Assessed the need for Mobilization I made an Assessment of medications to be adjusted accordingly Safety Risk assessment completed  CASE DISCUSSED IN MULTIDISCIPLINARY ROUNDS WITH ICU TEAM   Critical Care  Time devoted to patient care services described in this note is 59 minutes.   Critical care was necessary to treat /prevent imminent and life-threatening deterioration.   PATIENT WITH VERY POOR PROGNOSIS I ANTICIPATE PROLONGED ICU LOS [facility] accepted patient for transfer, pending bed placement  Patient is critically ill. Patient with Multiorgan failure and at high risk for cardiac arrest and death.    Corrin Parker, M.D.  [facility]  Medical Director [place] Director [facility] Cardio-Pulmonary Department

## 2020-11-23 NOTE — Anesthesia Postprocedure Evaluation (Signed)
Anesthesia Post Note  Patient: Corrinne Eagle Velaquez  Procedure(s) Performed: ARTERIOVENOUS (AV) FISTULA CREATION ( BRACHIAL CEPHALIC) (Left: Arm Upper)  Patient location during evaluation: SICU Anesthesia Type: General Level of consciousness: sedated Pain management: pain level controlled Vital Signs Assessment: post-procedure vital signs reviewed and stable Respiratory status: patient remains intubated per anesthesia plan Cardiovascular status: stable Postop Assessment: no apparent nausea or vomiting Anesthetic complications: yes   Encounter Notable Events  Notable Event Outcome Phase Comment  Desaturation < 90% for over 3 min or < 80% for over 1 min  Intraprocedure MDA called.  Repositioning pulse ox checking cap refill.  good cap refill  Hypotension  Intraprocedure Hypotension noted.  MDA called.  Repostioning NIBP and starting phenylephrine drip  Unplanned ICU admit  In Recovery transferred to ICU handoff to RN, attending MD     Last Vitals:  Vitals:   11/23/20 0500 11/23/20 0600  BP: 112/80 106/80  Pulse: (!) 59 (!) 59  Resp: 20 16  Temp: (!) 33.8 C (!) 33.8 C  SpO2: 100% 99%    Last Pain:  Vitals:   11/23/20 0400  TempSrc: Esophageal  PainSc:                  Margaree Mackintosh

## 2020-11-23 NOTE — Progress Notes (Signed)
[facility] Daily Progress Note  Subjective: Intubated & Sedated.   Objective: Vitals:   11/23/20 1345 11/23/20 1400 11/23/20 1415 11/23/20 1430  BP: '90/68 93/71 95/77 '$ (!) 93/40  Pulse: (!) 53 (!) 55 (!) 55 (!) 58  Resp: 20 19 (!) 22 17  Temp: (!) 91.9 F (33.3 C) (!) 91.9 F (33.3 C) (!) 92.1 F (33.4 C)   TempSrc: Esophageal Esophageal Esophageal   SpO2: 99% 100% 100% 98%  Weight:      Height:        Intake/Output Summary (Last 24 hours) at 11/23/2020 1449 Last data filed at 11/23/2020 1400 Gross per 24 hour  Intake 3165.79 ml  Output 1451 ml  Net 1714.79 ml   Physical Exam: Intubated. On vent. CV: RRR Pulmonary: Intubated. Decreased breath sounds.  Abdomen: Soft, Non-tender, Non-distended Vascular:  Left Upper Extremity: Cool to touch. Staples intact, clean and dry.    Laboratory: CBC    Component Value Date/Time   WBC 22.3 (H) 11/23/2020 0337   HGB 15.2 11/23/2020 0337   HCT 46.9 11/23/2020 0337   PLT 133 (L) 11/23/2020 0337   BMET    Component Value Date/Time   NA 141 11/23/2020 0337   K 5.0 11/23/2020 0337   CL 104 11/23/2020 0337   CO2 19 (L) 11/23/2020 0337   GLUCOSE 181 (H) 11/23/2020 0337   BUN 63 (H) 11/23/2020 0337   CREATININE 7.01 (H) 11/23/2020 0337   CALCIUM 9.2 11/23/2020 0337   GFRNONAA 8 (L) 11/23/2020 0337   GFRAA 27 (L) 11/30/2018 1438   Assessment/Planning: The patient is a 60 year old male s/p brachiocephalic AV fistula creation - POD#1   1) Complicated course due cardiac event after surgery. Now in ICU on vent with pressors on board. 2) Appreciate the assistance of additional team members during patients stay 3) Family is requesting transfer to [facility]. Family at bedside throughout the day.  4) Will continue to follow.  Discussed with Dr. Eber Hong Cosmo Tetreault PA-C 11/23/2020 2:49 PM

## 2020-11-23 NOTE — Anesthesia Procedure Notes (Signed)
Arterial Line Insertion Performed by: Lennox Pippins, MD, anesthesiologist  Preanesthetic checklist: patient identified, IV checked, site marked, risks and benefits discussed, surgical consent, monitors and equipment checked, pre-op evaluation, timeout performed and anesthesia consent Right, femoral was placed Catheter size: 20 G Hand hygiene performed , maximum sterile barriers used  and Seldinger technique used  Attempts: 1 Procedure performed without using ultrasound guided technique. Following insertion, line sutured, dressing applied and Biopatch. Post procedure assessment: normal  Patient tolerated the procedure well with no immediate complications.

## 2020-11-23 NOTE — Anesthesia Procedure Notes (Signed)
Central Venous Catheter Insertion Performed by: Lennox Pippins, MD, anesthesiologist Start/End9/14/2022 3:00 PM, 11/22/2020 3:30 PM Patient location: OR. Preanesthetic checklist: patient identified, IV checked, site marked, risks and benefits discussed, surgical consent, monitors and equipment checked, pre-op evaluation, timeout performed and anesthesia consent Hand hygiene performed , maximum sterile barriers used  and Seldinger technique used Catheter size: 7 Fr Total catheter length 20. Central line was placed.Double lumen Procedure performed using ultrasound guided technique. Ultrasound Notes:anatomy identified, needle tip was noted to be adjacent to the nerve/plexus identified and no ultrasound evidence of intravascular and/or intraneural injection Attempts: 1 Following insertion, line sutured, dressing applied and Biopatch. Post procedure assessment: blood return through all ports, free fluid flow and no air  Patient tolerated the procedure well with no immediate complications.

## 2020-11-23 NOTE — Discharge Summary (Addendum)
DISCHARGE DATE 11/23/2020    NAME:  Louis Huynh, MRN:  [id number], DOB:  04/10/1960, LOS: 1 ADMISSION DATE:  11/22/2020 CHIEF COMPLAINT:  Respiratory failure      BRIEF SYNOPSIS 60 y.o. with ESRD male s/p AVF placement by vascular surgery. Patient underwent general anesthesia and during procedure, there was evidence of probable MI with severe cardiogenic shock. Patient transferred to ICU post op, with severe resp failure post op resp failure. Upon arrival to ICU patient had sudden cardiac arrest status post ACLS noted PEA x 5 on 9/14    Pertinent  Medical History        Past Medical History:  Diagnosis Date   Anemia     Atrial flutter (HCC)     CAD (coronary artery disease)     CHF (congestive heart failure) (HCC)     Chronic anticoagulation      Apixaban   Coronary artery disease     DCM (dilated cardiomyopathy) (HCC)     Elevated PSA     Erectile dysfunction     ESRD (end stage renal disease) ([place])     Headache     HLD (hyperlipidemia)     Hypertension     OSA on CPAP     PAH (pulmonary artery hypertension) (HCC)     PTSD (post-traumatic stress disorder)     Symptomatic bradycardia          Significant Hospital Events: Including procedures, antibiotic start and stop dates in addition to other pertinent events   Patient status post elective left brachial cephalic arteriovenous fistula placement for ESRD on 9/14. Procedure complicated by suspected acute MI during procedure with hemodynamic instability, hypotension and tachycardia with rapid atrial flutter with levophed and phenylephrine gtts started. Admitted to the ICU from [facility] on 9/14 intubated for severe respiratory failure and hemodynamic instability with cardiogenic shock, hypothermic. Upon arrival to the ICU, patient had sudden cardiac arrest noted PEA versus wide-complex v-tach status post ACLS x 5 9/14. Noted bleeding from OGT and ETT suspect trauma from ACLS. Amiodarone bolus with gtt started, patient on  propofol, fentanyl, levophed, and phenylephrine, and epinephrine gtts.  9/15: Remains intubated, on propofol, levophed, amiodarone gtts, with Bair hugger warming blanket. Arousable on propofol gtt, opens eyes, following simple commands, not moving extremities. CRRT started early 9/15 for worsening renal function. Family requesting transfer to [facility] system; patient with extensive family history of nonischemic dilated CM and family receives care through [facility].    Micro Data:  No cultures Respiratory viral panel COVID, MRSA, and Influenza screening 9/14 negative   Antimicrobials:  None   Interim History / Subjective:  Remains intubated, on propofol, levophed, amiodarone gtts, with Bair hugger warming blanket. Arousable on propofol gtt, opens eyes, following simple commands, not moving extremities. CRRT started early 9/15 for worsening renal function.  Patient unable to provide subjective history due to ventilator support and sedation. Multiple family members intermittently visiting at bedside.    Objective   Blood pressure 92/66, pulse (!) 57, temperature (!) 92.5 F (33.6 C), temperature source Esophageal, resp. rate 17, height 5' 8" (1.727 m), weight 106.5 kg, SpO2 99 %.     >  Vent Mode: PRVC FiO2 (%):  [35 %-100 %] 35 % Set Rate:  [15 bmp] 15 bmp Vt Set:  [500 mL] 500 mL PEEP:  [5 cmH20-10 cmH20] 5 cmH20 Plateau Pressure:  [26 cmH20] 26 cmH20      Intake/Output Summary (Last 24 hours) at 11/23/2020 1132 Last data filed  at 11/23/2020 1100    Gross per 24 hour  Intake 3568.72 ml  Output 1385 ml  Net 2183.72 ml        Filed Weights    11/22/20 1131 11/23/20 0344  Weight: 101.2 kg 106.5 kg      REVIEW OF SYSTEMS PATIENT IS UNABLE TO PROVIDE COMPLETE REVIEW OF SYSTEMS DUE TO SEVERE CRITICAL ILLNESS AND TOXIC METABOLIC ENCEPHALOPATHY   PHYSICAL EXAMINATION: GENERAL: critically ill appearing, on CRRT, +Bair warmer EYES: Pupils equal, round, reactive to light.  No scleral icterus.   MOUTH: Moist mucosal membrane. INTUBATED NECK: Supple.  PULMONARY: +rhonchi CARDIOVASCULAR: S1 and S2.  No murmurs  GASTROINTESTINAL: Soft, nontender, + distended. Positive bowel sounds, hypoactive MUSCULOSKELETAL: No swelling, clubbing. 2+ edema bilat LE NEUROLOGIC:  opens eyes and follows commands SKIN:intact,warm,dry     Labs/imaging that I havepersonally reviewed  (right click and "Reselect all SmartList Selections" daily)  CXR 11/23/20: cardiomegaly, mild scattered infiltrates bilaterally bibasilar atelectasis without s/s pulmonary edema or effusion. ETT noted above the carina.   ASSESSMENT AND PLAN   60 yo male with ESRD s/p AVF placement by vascular surgery 9/14 sudden cardiac arrest due to V-tach/A-flutter with non-ischemic cardiomyopathy leading to acute cardiac systolic failure status post ACLS noted PEA x 5 on 9/14. Now on CRRT for worsening acidosis, sedated on propofol but awake, following simple commands.    Severe ACUTE Hypoxic and Hypercapnic Respiratory Failure - Continue Mechanical Ventilator support - Continue Bronchodilator Therapy - Continue hydrocortisone 159m IV Q12h -Wean Fio2 and PEEP as tolerated -VAP/VENT bundle implementation -will NOT perform SAT/SBT when respiratory parameters are met    >  Vent Mode: PRVC FiO2 (%):  [35 %-100 %] 35 % Set Rate:  [15 bmp] 15 bmp Vt Set:  [500 mL] 500 mL PEEP:  [5 cmH20-10 cmH20] 5 cmH20 Plateau Pressure:  [26 cmH20] 26 cmH20     CARDIAC FAILURE-acute systolic dysfunction EF 220%Severe cardiogenic shock - Maintain MAP > 65 - Pressors as needed; currently on levophed only   CARDIAC Atrial flutter, s/p v-tach s/p ACLS CODE BLUE x5  - ICU monitoring - Cardiology consult reviewed. Patient with personal and extensive family history of non-ischemic dilated cardiomyopathy, family follows with [facility] and requested transfer[facility]  - Amiodarone gtt    ESRD  - CRRT  started early 9/15, per nephrology recommendations - Continue Foley Catheter-assess need. Follow urine output - Monitor BMP - Avoid nephrotoxic agents - Renal dose medications   Intake/Output Summary (Last 24 hours) at 11/23/2020 1132 Last data filed at 11/23/2020 1100    Gross per 24 hour  Intake 3568.72 ml  Output 1385 ml  Net 2183.72 ml    NEUROLOGY Acute toxic metabolic encephalopathy - Daily sedation vacation to assess neuro status - Minimize use of sedatives   Leukocytosis, Likely reactive - Monitor CBC qam - Hold off on initiating empiric antibiotics for now - Pan culture and consider antibiotics if worsening WBC or spikes fever > 100.9   ENDO - ICU hypoglycemic\Hyperglycemia protocol -check FSBS per protocol    ELECTROLYTES -follow labs as needed -replace as needed -pharmacy consultation and following   Code Status:  FULL Disposition:ICU Pending transfer to [facility] once bed assigned [facility] DR NClovis Pu    Best practice (right click and "Reselect all SmartList Selections" daily)  Diet: NPO - bowel rest today, reassess in am 9/16 Pain/Anxiety/Delirium protocol (if indicated):  Yes (RASS goal -2) VAP protocol (if indicated): Yes DVT prophylaxis: Subcutaneous Heparin GI prophylaxis: H2B Glucose control:  SSI Yes Central venous access:  Yes, and it is still needed Arterial line:  Yes, and it is still needed Foley:  Yes, and it is still needed Mobility:  bed rest  Code Status:  FULL Disposition:ICU   Labs   CBC: Last Labs        Recent Labs  Lab 11/22/20 1602 11/22/20 2039 11/23/20 0337  WBC 7.0 11.0* 22.3*  HGB 13.6 15.9 15.2  HCT 39.9 48.3 46.9  MCV 95.7 100.4* 97.3  PLT 134* 122* 133*        Basic Metabolic Panel: Last Labs         Recent Labs  Lab 11/22/20 1139 11/22/20 1602 11/22/20 2039 11/23/20 0337  NA  --  162* 141 141  K 5.0 5.5* 6.3* 5.0  CL  --  100 104 104  CO2  --  48* 15*  19*  GLUCOSE  --  92 74 181*  BUN  --  67* 67* 63*  CREATININE  --  7.26* 8.64* 7.01*  CALCIUM  --  7.6* 9.8 9.2  MG  --   --  2.3 2.1  PHOS  --   --   --  8.8*      GFR: Estimated Creatinine Clearance: 13.6 mL/min (A) (by C-G formula based on SCr of 7.01 mg/dL (H)). Last Labs          Recent Labs  Lab 11/22/20 1602 11/22/20 1604 11/22/20 2039 11/22/20 2321 11/23/20 0337  PROCALCITON  --   --  0.83  --  4.13  WBC 7.0  --  11.0*  --  22.3*  LATICACIDVEN  --  5.9* 6.2* 7.1*  --         Liver Function Tests: Last Labs        Recent Labs  Lab 11/22/20 1602 11/22/20 2039 11/23/20 0337  AST 19 148*  --   ALT 19 96*  --   ALKPHOS 59 76  --   BILITOT 1.3* 2.6*  --   PROT 5.1* 6.1*  --   ALBUMIN 2.8* 3.1* 3.2*      Last Labs   No results for input(s): LIPASE, AMYLASE in the last 168 hours.   Last Labs   No results for input(s): AMMONIA in the last 168 hours.     ABG Labs (Brief)          Component Value Date/Time    PHART 7.37 11/23/2020 0509    PCO2ART 32 11/23/2020 0509    PO2ART 221 (H) 11/23/2020 0509    HCO3 19.1 (L) 11/23/2020 0509    ACIDBASEDEF 6.4 (H) 11/23/2020 0509    O2SAT 99.8 11/23/2020 0509        Coagulation Profile: Last Labs      Recent Labs  Lab 11/23/20 0337  INR 2.2*        Cardiac Enzymes: Last Labs   No results for input(s): CKTOTAL, CKMB, CKMBINDEX, TROPONINI in the last 168 hours.     HbA1C: Last Labs  No results found for: HGBA1C     CBG: Last Labs          Recent Labs  Lab 11/23/20 0009 11/23/20 0013 11/23/20 0052 11/23/20 0339 11/23/20 0748  GLUCAP 41* 63* 156* 176* 174*          Past Medical History:  He,  has a past medical history of Anemia, Atrial  flutter ([place]), CAD (coronary artery disease), CHF (congestive heart failure) ([place]), Chronic anticoagulation, Coronary artery disease, DCM (dilated cardiomyopathy) ([place]), Elevated PSA, Erectile dysfunction, ESRD (end stage renal disease) ([place]), Headache,  HLD (hyperlipidemia), Hypertension, OSA on CPAP, PAH (pulmonary artery hypertension) ([place]), PTSD (post-traumatic stress disorder), and Symptomatic bradycardia.    Surgical History:         Past Surgical History:  Procedure Laterality Date   RIGHT HEART CATH Right 10/25/2019    Location: [facility]; Surgeon: Golden Circle, MD   RIGHT HEART CATH Right 01/21/2020    Location: [facility]; Surgeon: Pura Spice, MD      Social History:   reports that he has never smoked. He has never used smokeless tobacco. He reports current alcohol use. He reports that he does not currently use drugs.    Family History:  His family history includes Heart attack in his brother; Heart failure in his brother; Hypertension in his brother, mother, and sister.    Allergies     Allergies  Allergen Reactions   Ace Inhibitors Hives      Home Medications         Prior to Admission medications   Medication Sig Start Date End Date Taking? Authorizing Provider  carvedilol (COREG) 25 MG tablet Take 0.5 tablets (12.5 mg total) by mouth 2 (two) times daily. 09/02/20   Yes Jennye Boroughs, MD  hydrALAZINE (APRESOLINE) 50 MG tablet Take 50 mg by mouth 3 (three) times daily.     Yes [provider]  HYDROcodone-acetaminophen (NORCO) 5-325 MG tablet Take 1-2 tablets by mouth every 6 (six) hours as needed for severe pain or moderate pain. 11/22/20   Yes Schnier, Dolores Lory, MD  isosorbide dinitrate (ISORDIL) 20 MG tablet Take 20 mg by mouth daily.     Yes [provider]  losartan (COZAAR) 100 MG tablet Take 100 mg by mouth daily.     Yes [provider]  Multiple Vitamin (MULTIVITAMIN) capsule Take 1 capsule by mouth at bedtime.     Yes [provider]  sevelamer carbonate (RENVELA) 800 MG tablet Take 1,600 mg by mouth 3 (three) times daily with meals.     Yes [provider]  apixaban (ELIQUIS) 5 MG TABS tablet Take 5 mg by mouth 2 (two) times daily.       [provider]      DISCHARGE MEDS AMIODARONE 30 mg/hr PEPCID 20 mg daily HEP SQ TID SOLU CORTEF 100 mg BID NOREPINEPHRINE 0-40 PROPOFOL INFUSION     DVT/GI PRX  assessed I Assessed the need for Labs I Assessed the need for Foley I Assessed the need for Central Venous Line Family Discussion when available I Assessed the need for Mobilization I made an Assessment of medications to be adjusted accordingly Safety Risk assessment completed   CASE DISCUSSED IN MULTIDISCIPLINARY ROUNDS WITH ICU TEAM     Critical Care Time devoted to patient care services described in this note is 59 minutes.    Critical care was necessary to treat /prevent imminent and life-threatening deterioration.     PATIENT WITH VERY POOR PROGNOSIS I ANTICIPATE PROLONGED ICU LOS [facility] accepted patient for transfer, pending bed placement   Patient is critically ill. Patient with Multiorgan failure and at high risk for cardiac arrest and death.     TRANSFER TO [facility]   Maretta Bees Patricia Pesa, M.D.  [facility]  Medical Director [place] Director [facility] Cardio-Pulmonary Department

## 2020-11-23 NOTE — Progress Notes (Signed)
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 Their initial arrival was around 16:30 and patient was removed from our monitor shortly after and placed on their monitor. Report already called. Life flight stated they would notify [facility] staff that they were on the way. All belongings already taken by patient's wife.

## 2020-11-23 NOTE — Progress Notes (Addendum)
Initial Nutrition Assessment  DOCUMENTATION CODES:   Obesity unspecified  INTERVENTION:   Once appropriate for tube feeds, recommend:   Vital HP '@30ml'$ /hr + ProSource TF 100m QID via tube   Free water flushes 36mq4 hours to maintain tube patency   Propofol: 21.3 ml/hr- provides 562kcal/day   Regimen provides 1602kcal/day, 151g/day protein and 78244may free water   Liquid MVI daily via tube   Rena-vit daily via tube   NUTRITION DIAGNOSIS:   Inadequate oral intake related to inability to eat (pt sedated and ventilated) as evidenced by NPO status.  GOAL:   Provide needs based on ASPEN/SCCM guidelines  MONITOR:   Vent status, Labs, Weight trends, Skin, I & O's  REASON FOR ASSESSMENT:   Ventilator    ASSESSMENT:   [age]o male with h/o DCM, OSA, ESRD on HD, MI, CHF, HTN, CAD and PTSD who is admitted with PEA arrest during AV fistula placement.  Pt sedated and ventilated. OGT in place noted to be ~3.6 cm distal to the expected region of the gastroesophageal junction; recommend advancement of tube ~5cm. No plans for tube feeds today; will plan to start feeds tomorrow once pt is more stable. Pt with some mild distension of his abdomen. Pt is receiving CRRT. Per chart, pt appears weight stable pta. Family at bedside reports pt with good oral intake at baseline.   Medications reviewed and include: colace, pepcid, heparin, solu-cortef, miralax, levophed, propofol  Labs reviewed: K 5.0 wnl, BUN 63(H), creat 7.01(H), P 8.8(H), Mg 2.1 wnl BNP 1665(H)- 9/14 Wbc- 22.3(H) Cbgs- 41, 63, 156, 176, 174 x 24 hrs   Patient is currently intubated on ventilator support MV: 8.0 L/min Temp (24hrs), Avg:93.9 F (34.4 C), Min:92.5 F (33.6 C), Max:97.8 F (36.6 C)  Propofol: 21.3 ml/hr- provides 562kcal/day   MAP- >55m52m  UOP- 17ml83mUTRITION - FOCUSED PHYSICAL EXAM:  Flowsheet Row Most Recent Value  Orbital Region No depletion  Upper Arm Region No depletion  Thoracic  and Lumbar Region No depletion  Buccal Region No depletion  Temple Region No depletion  Clavicle Bone Region No depletion  Clavicle and Acromion Bone Region No depletion  Scapular Bone Region No depletion  Dorsal Hand No depletion  Patellar Region No depletion  Anterior Thigh Region No depletion  Posterior Calf Region No depletion  Edema (RD Assessment) Moderate  Hair Reviewed  Eyes Reviewed  Mouth Reviewed  Skin Reviewed  Nails Reviewed   Diet Order:   Diet Order             Diet NPO time specified  Diet effective now                  EDUCATION NEEDS:   No education needs have been identified at this time  Skin:  Skin Assessment: Reviewed RN Assessment (ecchymosis, incision L arm)  Last BM:  pta  Height:   Ht Readings from Last 1 Encounters:  11/22/20 '5\' 8"'$  (1.727 m)    Weight:   Wt Readings from Last 1 Encounters:  11/23/20 106.5 kg    Ideal Body Weight:  70 kg  BMI:  Body mass index is 35.7 kg/m.  Estimated Nutritional Needs:   Kcal:  1172-1491kcal/day  Protein:  >140g/day  Fluid:  2.1-2.4L/day  CaseyKoleen DistanceRD, LDN Please refer to [facility] and/or RD on-call/weekend/after hours pager

## 2020-11-23 NOTE — Addendum Note (Signed)
Addendum  created 11/23/20 0818 by Lennox Pippins, MD   Attestation recorded in [place], Child order released for a procedure order, Clinical Note Signed, Intraprocedure Attestations filed, Intraprocedure Blocks edited, Dance movement psychotherapist edited, LDA created via procedure documentation, SmartForm saved

## 2020-11-24 NOTE — Discharge Summary (Signed)
DISCHARGE DATE 11/23/2020    NAME:  Louis Huynh, MRN:  [id number], DOB:  08/11/1960, LOS: 1 ADMISSION DATE:  11/22/2020 CHIEF COMPLAINT:  Respiratory failure      BRIEF SYNOPSIS 60 y.o. with ESRD male s/p AVF placement by vascular surgery. Patient underwent general anesthesia and during procedure, there was evidence of probable MI with severe cardiogenic shock. Patient transferred to ICU post op, with severe resp failure post op resp failure. Upon arrival to ICU patient had sudden cardiac arrest status post ACLS noted PEA x 5 on 9/14    Pertinent  Medical History        Past Medical History:  Diagnosis Date   Anemia     Atrial flutter (HCC)     CAD (coronary artery disease)     CHF (congestive heart failure) (HCC)     Chronic anticoagulation      Apixaban   Coronary artery disease     DCM (dilated cardiomyopathy) (HCC)     Elevated PSA     Erectile dysfunction     ESRD (end stage renal disease) (HCC)     Headache     HLD (hyperlipidemia)     Hypertension     OSA on CPAP     PAH (pulmonary artery hypertension) (HCC)     PTSD (post-traumatic stress disorder)     Symptomatic bradycardia          Significant Hospital Events: Including procedures, antibiotic start and stop dates in addition to other pertinent events   Patient status post elective left brachial cephalic arteriovenous fistula placement for ESRD on 9/14. Procedure complicated by suspected acute MI during procedure with hemodynamic instability, hypotension and tachycardia with rapid atrial flutter with levophed and phenylephrine gtts started. Admitted to the ICU from OR on 9/14 intubated for severe respiratory failure and hemodynamic instability with cardiogenic shock, hypothermic. Upon arrival to the ICU, patient had sudden cardiac arrest noted PEA versus wide-complex v-tach status post ACLS x 5 9/14. Noted bleeding from OGT and ETT suspect trauma from ACLS. Amiodarone bolus with gtt started, patient on  propofol, fentanyl, levophed, and phenylephrine, and epinephrine gtts.  9/15: Remains intubated, on propofol, levophed, amiodarone gtts, with Bair hugger warming blanket. Arousable on propofol gtt, opens eyes, following simple commands, not moving extremities. CRRT started early 9/15 for worsening renal function. Family requesting transfer to [facility] system; patient with extensive family history of nonischemic dilated CM and family receives care through [facility].    Micro Data:  No cultures Respiratory viral panel COVID, MRSA, and Influenza screening 9/14 negative   Antimicrobials:  None   Interim History / Subjective:  Remains intubated, on propofol, levophed, amiodarone gtts, with Bair hugger warming blanket. Arousable on propofol gtt, opens eyes, following simple commands, not moving extremities. CRRT started early 9/15 for worsening renal function.  Patient unable to provide subjective history due to ventilator support and sedation. Multiple family members intermittently visiting at bedside.    Objective   Blood pressure 92/66, pulse (!) 57, temperature (!) 92.5 F (33.6 C), temperature source Esophageal, resp. rate 17, height 5' 8" (1.727 m), weight 106.5 kg, SpO2 99 %.     >  Vent Mode: PRVC FiO2 (%):  [35 %-100 %] 35 % Set Rate:  [15 bmp] 15 bmp Vt Set:  [500 mL] 500 mL PEEP:  [5 cmH20-10 cmH20] 5 cmH20 Plateau Pressure:  [26 cmH20] 26 cmH20      Intake/Output Summary (Last 24 hours) at 11/23/2020 1132 Last data filed   at 11/23/2020 1100    Gross per 24 hour  Intake 3568.72 ml  Output 1385 ml  Net 2183.72 ml        Filed Weights    11/22/20 1131 11/23/20 0344  Weight: 101.2 kg 106.5 kg      REVIEW OF SYSTEMS PATIENT IS UNABLE TO PROVIDE COMPLETE REVIEW OF SYSTEMS DUE TO SEVERE CRITICAL ILLNESS AND TOXIC METABOLIC ENCEPHALOPATHY   PHYSICAL EXAMINATION: GENERAL: critically ill appearing, on CRRT, +Bair warmer EYES: Pupils equal, round, reactive to light.  No scleral icterus.   MOUTH: Moist mucosal membrane. INTUBATED NECK: Supple.  PULMONARY: +rhonchi CARDIOVASCULAR: S1 and S2.  No murmurs  GASTROINTESTINAL: Soft, nontender, + distended. Positive bowel sounds, hypoactive MUSCULOSKELETAL: No swelling, clubbing. 2+ edema bilat LE NEUROLOGIC:  opens eyes and follows commands SKIN:intact,warm,dry     Labs/imaging that I havepersonally reviewed  (right click and "Reselect all SmartList Selections" daily)  CXR 11/23/20: cardiomegaly, mild scattered infiltrates bilaterally bibasilar atelectasis without s/s pulmonary edema or effusion. ETT noted above the carina.   ASSESSMENT AND PLAN   61 yo male with ESRD s/p AVF placement by vascular surgery 9/14 sudden cardiac arrest due to V-tach/A-flutter with non-ischemic cardiomyopathy leading to acute cardiac systolic failure status post ACLS noted PEA x 5 on 9/14. Now on CRRT for worsening acidosis, sedated on propofol but awake, following simple commands.    Severe ACUTE Hypoxic and Hypercapnic Respiratory Failure - Continue Mechanical Ventilator support - Continue Bronchodilator Therapy - Continue hydrocortisone 159m IV Q12h -Wean Fio2 and PEEP as tolerated -VAP/VENT bundle implementation -will NOT perform SAT/SBT when respiratory parameters are met    >  Vent Mode: PRVC FiO2 (%):  [35 %-100 %] 35 % Set Rate:  [15 bmp] 15 bmp Vt Set:  [500 mL] 500 mL PEEP:  [5 cmH20-10 cmH20] 5 cmH20 Plateau Pressure:  [26 cmH20] 26 cmH20     CARDIAC FAILURE-acute systolic dysfunction EF 220%Severe cardiogenic shock - Maintain MAP > 65 - Pressors as needed; currently on levophed only   CARDIAC Atrial flutter, s/p v-tach s/p ACLS CODE BLUE x5  - ICU monitoring - Cardiology consult reviewed. Patient with personal and extensive family history of non-ischemic dilated cardiomyopathy, family follows with [facility] and [facility] transfer[facility]  - Amiodarone gtt    ESRD  - CRRT  started early 9/15, per nephrology recommendations - Continue Foley Catheter-assess need. Follow urine output - Monitor BMP - Avoid nephrotoxic agents - Renal dose medications   Intake/Output Summary (Last 24 hours) at 11/23/2020 1132 Last data filed at 11/23/2020 1100    Gross per 24 hour  Intake 3568.72 ml  Output 1385 ml  Net 2183.72 ml    NEUROLOGY Acute toxic metabolic encephalopathy - Daily sedation vacation to assess neuro status - Minimize use of sedatives   Leukocytosis, Likely reactive - Monitor CBC qam - Hold off on initiating empiric antibiotics for now - Pan culture and consider antibiotics if worsening WBC or spikes fever > 100.9   ENDO - ICU hypoglycemic\Hyperglycemia protocol -check FSBS per protocol    ELECTROLYTES -follow labs as needed -replace as needed -pharmacy consultation and following   Code Status:  FULL Disposition:ICU Pending transfer to [facility] once bed assigned [facility] DR NClovis Pu    Best practice (right click and "Reselect all SmartList Selections" daily)  Diet: NPO - bowel rest today, reassess in am 9/16 Pain/Anxiety/Delirium protocol (if indicated):  Yes (RASS goal -2) VAP protocol (if indicated): Yes DVT prophylaxis: Subcutaneous Heparin GI prophylaxis: H2B Glucose control:  SSI Yes Central venous access:  Yes, and it is still needed Arterial line:  Yes, and it is still needed Foley:  Yes, and it is still needed Mobility:  bed rest  Code Status:  FULL Disposition:ICU   Labs   CBC: Last Labs        Recent Labs  Lab 11/22/20 1602 11/22/20 2039 11/23/20 0337  WBC 7.0 11.0* 22.3*  HGB 13.6 15.9 15.2  HCT 39.9 48.3 46.9  MCV 95.7 100.4* 97.3  PLT 134* 122* 133*        Basic Metabolic Panel: Last Labs         Recent Labs  Lab 11/22/20 1139 11/22/20 1602 11/22/20 2039 11/23/20 0337  NA  --  162* 141 141  K 5.0 5.5* 6.3* 5.0  CL  --  100 104 104  CO2  --  48* 15*  19*  GLUCOSE  --  92 74 181*  BUN  --  67* 67* 63*  CREATININE  --  7.26* 8.64* 7.01*  CALCIUM  --  7.6* 9.8 9.2  MG  --   --  2.3 2.1  PHOS  --   --   --  8.8*      GFR: Estimated Creatinine Clearance: 13.6 mL/min (A) (by C-G formula based on SCr of 7.01 mg/dL (H)). Last Labs          Recent Labs  Lab 11/22/20 1602 11/22/20 1604 11/22/20 2039 11/22/20 2321 11/23/20 0337  PROCALCITON  --   --  0.83  --  4.13  WBC 7.0  --  11.0*  --  22.3*  LATICACIDVEN  --  5.9* 6.2* 7.1*  --         Liver Function Tests: Last Labs        Recent Labs  Lab 11/22/20 1602 11/22/20 2039 11/23/20 0337  AST 19 148*  --   ALT 19 96*  --   ALKPHOS 59 76  --   BILITOT 1.3* 2.6*  --   PROT 5.1* 6.1*  --   ALBUMIN 2.8* 3.1* 3.2*      Last Labs   No results for input(s): LIPASE, AMYLASE in the last 168 hours.   Last Labs   No results for input(s): AMMONIA in the last 168 hours.     ABG Labs (Brief)          Component Value Date/Time    PHART 7.37 11/23/2020 0509    PCO2ART 32 11/23/2020 0509    PO2ART 221 (H) 11/23/2020 0509    HCO3 19.1 (L) 11/23/2020 0509    ACIDBASEDEF 6.4 (H) 11/23/2020 0509    O2SAT 99.8 11/23/2020 0509        Coagulation Profile: Last Labs      Recent Labs  Lab 11/23/20 0337  INR 2.2*        Cardiac Enzymes: Last Labs   No results for input(s): CKTOTAL, CKMB, CKMBINDEX, TROPONINI in the last 168 hours.     HbA1C: Last Labs  No results found for: HGBA1C     CBG: Last Labs          Recent Labs  Lab 11/23/20 0009 11/23/20 0013 11/23/20 0052 11/23/20 0339 11/23/20 0748  GLUCAP 41* 63* 156* 176* 174*          Past Medical History:  He,  has a past medical history of Anemia, Atrial   flutter (HCC), CAD (coronary artery disease), CHF (congestive heart failure) (HCC), Chronic anticoagulation, Coronary artery disease, DCM (dilated cardiomyopathy) (HCC), Elevated PSA, Erectile dysfunction, ESRD (end stage renal disease) (HCC), Headache,  HLD (hyperlipidemia), Hypertension, OSA on CPAP, PAH (pulmonary artery hypertension) (HCC), PTSD (post-traumatic stress disorder), and Symptomatic bradycardia.    Surgical History:         Past Surgical History:  Procedure Laterality Date   RIGHT HEART CATH Right 10/25/2019    Location: [facility]; Surgeon: Jeff Klein, MD   RIGHT HEART CATH Right 01/21/2020    Location: [facility]; Surgeon: Michael Sketch, MD      Social History:   reports that he has never smoked. He has never used smokeless tobacco. He reports current alcohol use. He reports that he does not currently use drugs.    Family History:  His family history includes Heart attack in his brother; Heart failure in his brother; Hypertension in his brother, mother, and sister.    Allergies     Allergies  Allergen Reactions   Ace Inhibitors Hives      Home Medications         Prior to Admission medications   Medication Sig Start Date End Date Taking? Authorizing Provider  carvedilol (COREG) 25 MG tablet Take 0.5 tablets (12.5 mg total) by mouth 2 (two) times daily. 09/02/20   Yes Ayiku, Bernard, MD  hydrALAZINE (APRESOLINE) 50 MG tablet Take 50 mg by mouth 3 (three) times daily.     Yes [provider]  HYDROcodone-acetaminophen (NORCO) 5-325 MG tablet Take 1-2 tablets by mouth every 6 (six) hours as needed for severe pain or moderate pain. 11/22/20   Yes Schnier, Gregory G, MD  isosorbide dinitrate (ISORDIL) 20 MG tablet Take 20 mg by mouth daily.     Yes [provider]  losartan (COZAAR) 100 MG tablet Take 100 mg by mouth daily.     Yes [provider]  Multiple Vitamin (MULTIVITAMIN) capsule Take 1 capsule by mouth at bedtime.     Yes [provider]  sevelamer carbonate (RENVELA) 800 MG tablet Take 1,600 mg by mouth 3 (three) times daily with meals.     Yes [provider]  apixaban (ELIQUIS) 5 MG TABS tablet Take 5 mg by mouth 2 (two) times daily.       [provider]      DISCHARGE MEDS AMIODARONE 30 mg/hr PEPCID 20 mg daily HEP SQ TID SOLU CORTEF 100 mg BID NOREPINEPHRINE 0-40 PROPOFOL INFUSION     DVT/GI PRX  assessed I Assessed the need for Labs I Assessed the need for Foley I Assessed the need for Central Venous Line Family Discussion when available I Assessed the need for Mobilization I made an Assessment of medications to be adjusted accordingly Safety Risk assessment completed   CASE DISCUSSED IN MULTIDISCIPLINARY ROUNDS WITH ICU TEAM     Critical Care Time devoted to patient care services described in this note is 59 minutes.    Critical care was necessary to treat /prevent imminent and life-threatening deterioration.     PATIENT WITH VERY POOR PROGNOSIS I ANTICIPATE PROLONGED ICU LOS [facility] accepted patient for transfer, pending bed placement   Patient is critically ill. Patient with Multiorgan failure and at high risk for cardiac arrest and death.     TRANSFER TO [facility]   Masiah Lewing David Mavrik Bynum, M.D.  [facility]  Medical Director ICU-[facility] [place] Medical Director [facility] Cardio-Pulmonary Department          

## 2020-11-29 LAB — GLUCOSE, CAPILLARY: Glucose-Capillary: 41 mg/dL — CL (ref 70–99)

## 2020-12-19 LAB — BLOOD GAS, ARTERIAL
Acid-base deficit: 13.8 mmol/L — ABNORMAL HIGH (ref 0.0–2.0)
Bicarbonate: 14.3 mmol/L — ABNORMAL LOW (ref 20.0–28.0)
O2 Saturation: 99.4 %
Patient temperature: 37
pCO2 arterial: 40 mmHg (ref 32.0–48.0)
pH, Arterial: 7.16 — CL (ref 7.350–7.450)
pO2, Arterial: 193 mmHg — ABNORMAL HIGH (ref 83.0–108.0)

## 2022-01-07 ENCOUNTER — Encounter (INDEPENDENT_AMBULATORY_CARE_PROVIDER_SITE_OTHER): Payer: Self-pay

## 2022-04-23 DIAGNOSIS — Z5181 Encounter for therapeutic drug level monitoring: Secondary | ICD-10-CM | POA: Diagnosis not present

## 2022-04-23 DIAGNOSIS — Z2989 Encounter for other specified prophylactic measures: Secondary | ICD-10-CM | POA: Diagnosis not present

## 2022-04-23 DIAGNOSIS — Z48298 Encounter for aftercare following other organ transplant: Secondary | ICD-10-CM | POA: Diagnosis not present

## 2022-04-23 DIAGNOSIS — Z79899 Other long term (current) drug therapy: Secondary | ICD-10-CM | POA: Diagnosis not present

## 2022-04-23 DIAGNOSIS — Z941 Heart transplant status: Secondary | ICD-10-CM | POA: Diagnosis not present

## 2022-04-23 DIAGNOSIS — Z940 Kidney transplant status: Secondary | ICD-10-CM | POA: Diagnosis not present

## 2022-04-23 DIAGNOSIS — Z789 Other specified health status: Secondary | ICD-10-CM | POA: Diagnosis not present

## 2022-06-04 DIAGNOSIS — E7800 Pure hypercholesterolemia, unspecified: Secondary | ICD-10-CM | POA: Diagnosis not present

## 2022-06-04 DIAGNOSIS — E213 Hyperparathyroidism, unspecified: Secondary | ICD-10-CM | POA: Diagnosis not present

## 2022-06-04 DIAGNOSIS — D849 Immunodeficiency, unspecified: Secondary | ICD-10-CM | POA: Diagnosis not present

## 2022-06-04 DIAGNOSIS — Z940 Kidney transplant status: Secondary | ICD-10-CM | POA: Diagnosis not present

## 2022-06-24 DIAGNOSIS — Z48298 Encounter for aftercare following other organ transplant: Secondary | ICD-10-CM | POA: Diagnosis not present

## 2022-06-24 DIAGNOSIS — I129 Hypertensive chronic kidney disease with stage 1 through stage 4 chronic kidney disease, or unspecified chronic kidney disease: Secondary | ICD-10-CM | POA: Diagnosis not present

## 2022-06-24 DIAGNOSIS — Z7901 Long term (current) use of anticoagulants: Secondary | ICD-10-CM | POA: Diagnosis not present

## 2022-06-24 DIAGNOSIS — N1832 Chronic kidney disease, stage 3b: Secondary | ICD-10-CM | POA: Diagnosis not present

## 2022-06-24 DIAGNOSIS — Z789 Other specified health status: Secondary | ICD-10-CM | POA: Diagnosis not present

## 2022-06-24 DIAGNOSIS — Z79899 Other long term (current) drug therapy: Secondary | ICD-10-CM | POA: Diagnosis not present

## 2022-06-24 DIAGNOSIS — Z940 Kidney transplant status: Secondary | ICD-10-CM | POA: Diagnosis not present

## 2022-06-24 DIAGNOSIS — Z2989 Encounter for other specified prophylactic measures: Secondary | ICD-10-CM | POA: Diagnosis not present

## 2022-06-24 DIAGNOSIS — N200 Calculus of kidney: Secondary | ICD-10-CM | POA: Diagnosis not present

## 2022-06-24 DIAGNOSIS — Z941 Heart transplant status: Secondary | ICD-10-CM | POA: Diagnosis not present

## 2022-06-24 DIAGNOSIS — D849 Immunodeficiency, unspecified: Secondary | ICD-10-CM | POA: Diagnosis not present

## 2022-06-24 DIAGNOSIS — Z86718 Personal history of other venous thrombosis and embolism: Secondary | ICD-10-CM | POA: Diagnosis not present

## 2022-06-24 DIAGNOSIS — Z4822 Encounter for aftercare following kidney transplant: Secondary | ICD-10-CM | POA: Diagnosis not present

## 2022-06-24 DIAGNOSIS — I272 Pulmonary hypertension, unspecified: Secondary | ICD-10-CM | POA: Diagnosis not present

## 2022-07-10 DIAGNOSIS — D849 Immunodeficiency, unspecified: Secondary | ICD-10-CM | POA: Diagnosis not present

## 2022-07-10 DIAGNOSIS — Z941 Heart transplant status: Secondary | ICD-10-CM | POA: Diagnosis not present

## 2022-07-10 DIAGNOSIS — Z48298 Encounter for aftercare following other organ transplant: Secondary | ICD-10-CM | POA: Diagnosis not present

## 2022-07-12 DIAGNOSIS — Z940 Kidney transplant status: Secondary | ICD-10-CM | POA: Diagnosis not present

## 2022-07-12 DIAGNOSIS — N200 Calculus of kidney: Secondary | ICD-10-CM | POA: Diagnosis not present

## 2023-02-19 IMAGING — CR DG CHEST 2V
2 series · 2 of 2 positions shown · non-contrast
Comparison: 10/21/2018

CLINICAL DATA: Near syncope occurring earlier today prior to
dialysis.

EXAM:
CHEST - 2 VIEW

[chest pa]
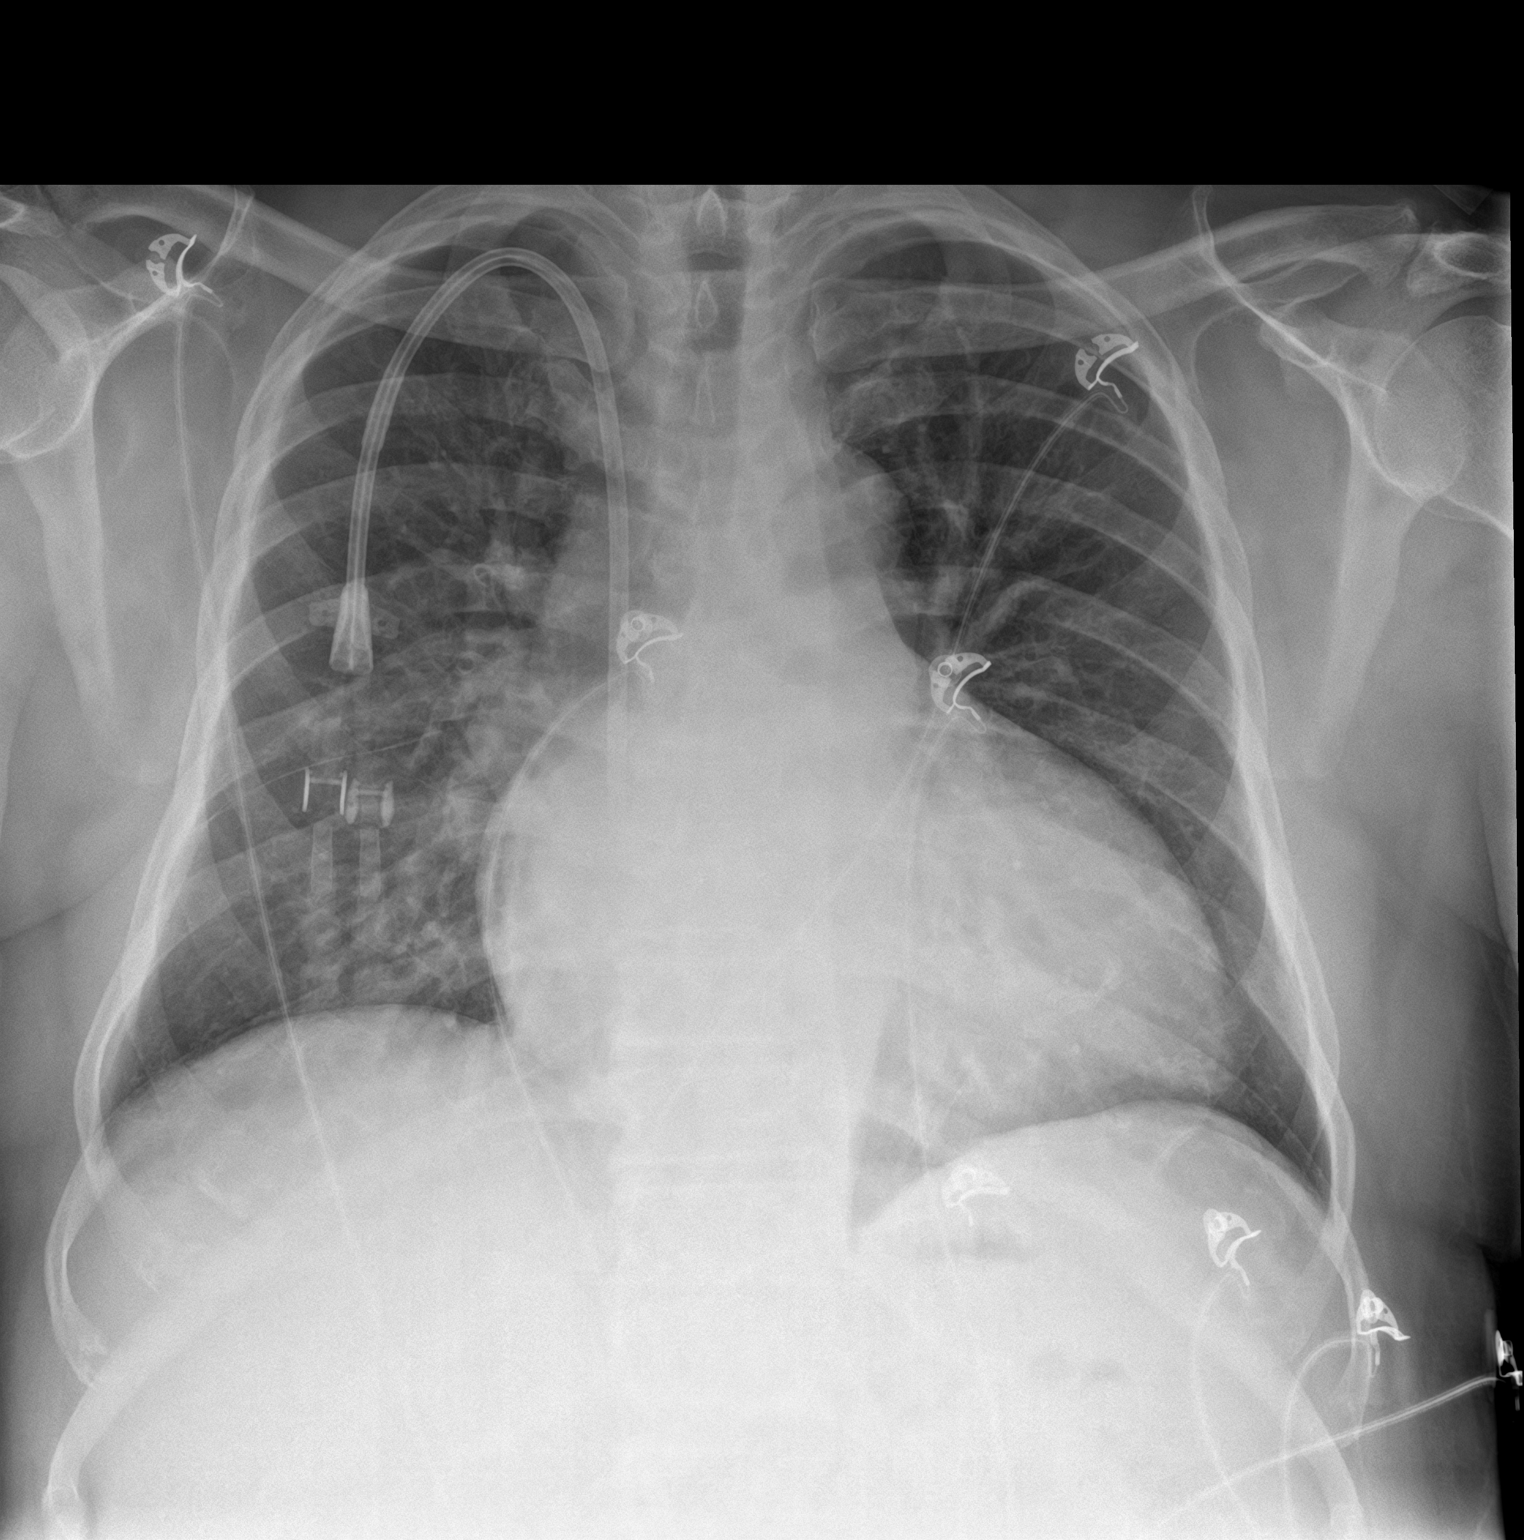

[chest lat]
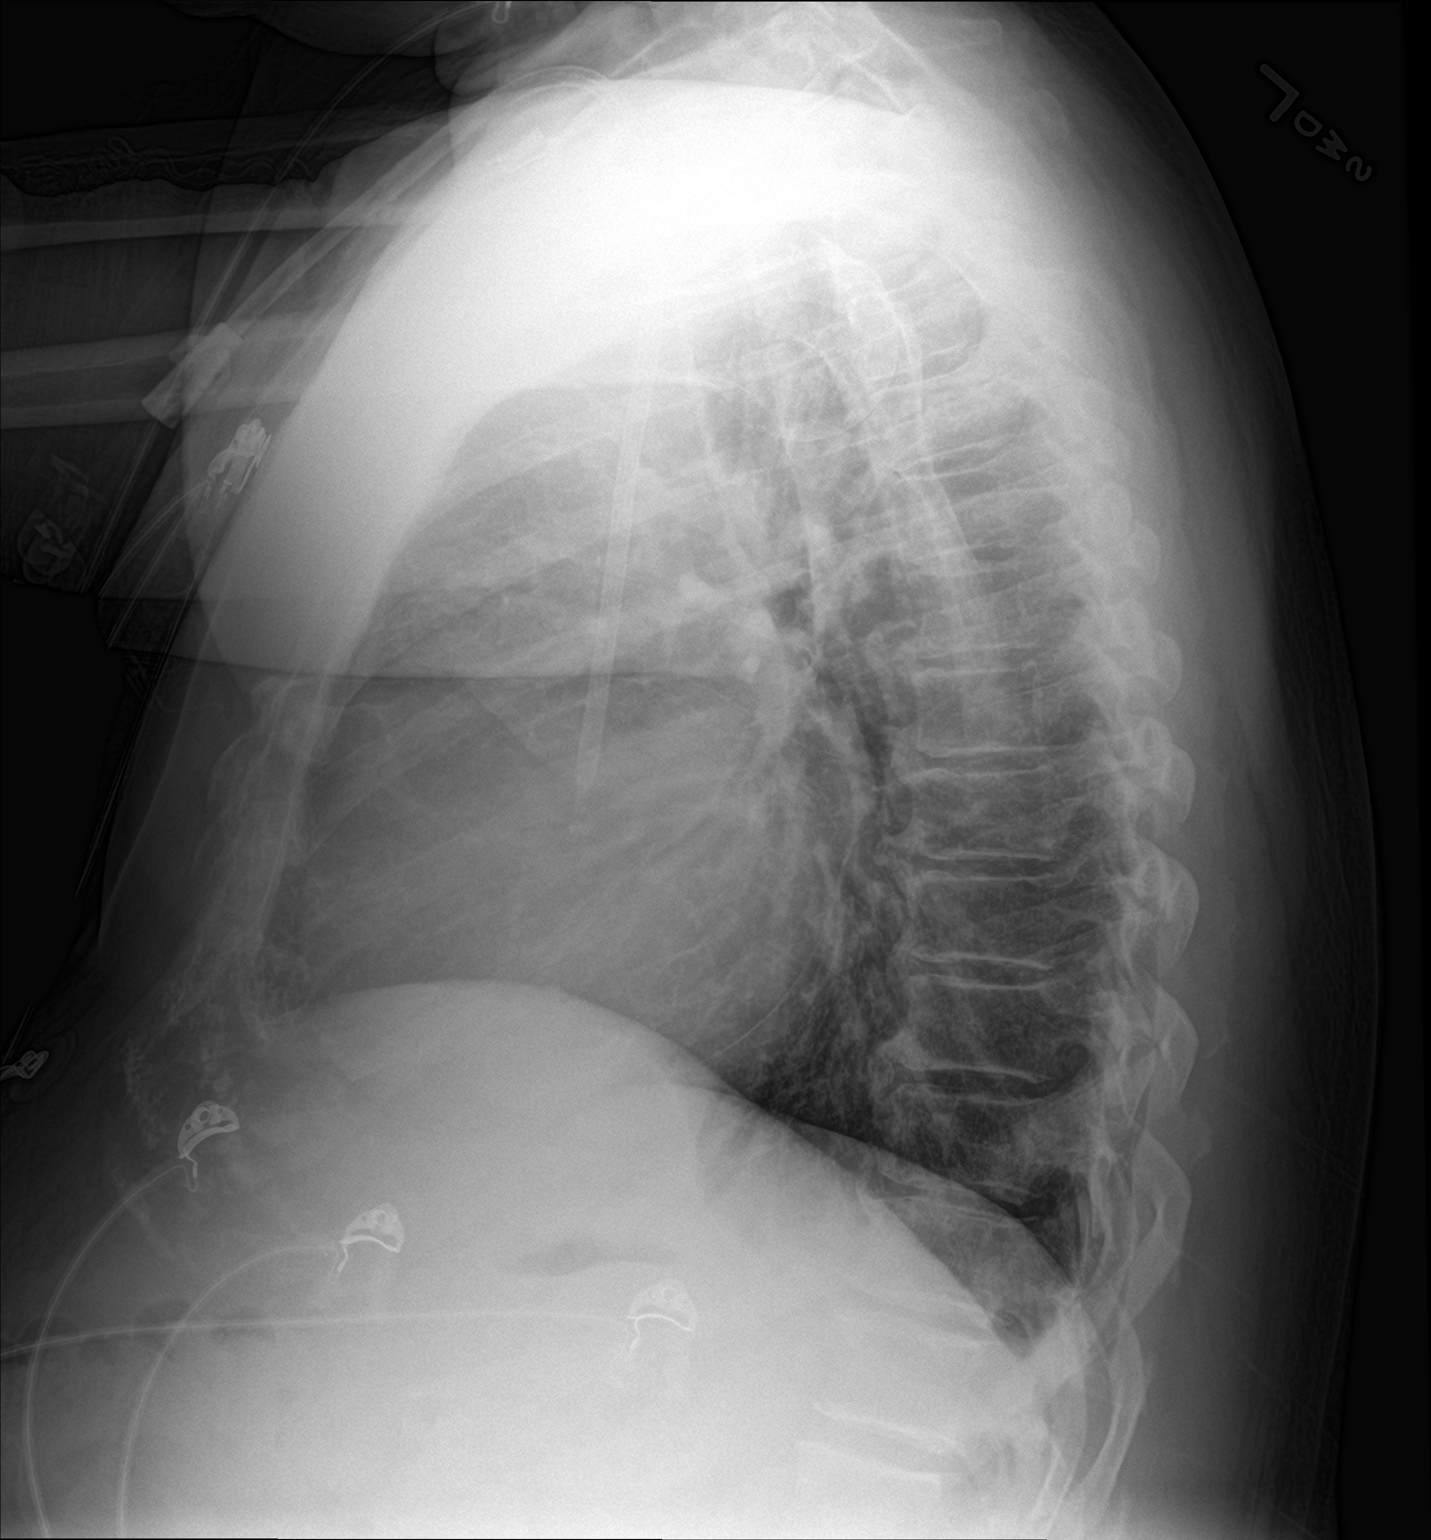

[2 of 2 positions shown; findings below may reference images not displayed]

FINDINGS: Cardiac silhouette is mildly enlarged. No mediastinal or hilar
masses or evidence of adenopathy.

Right anterior chest wall tunneled internal jugular dual lumen
central venous catheter has its distal tip projecting in the right
atrium. Catheter new since the prior study.

Clear lungs.  No pleural effusion or pneumothorax.

Skeletal structures are intact.
IMPRESSION: 1. No acute cardiopulmonary disease.

## 2023-03-06 DIAGNOSIS — Z03818 Encounter for observation for suspected exposure to other biological agents ruled out: Secondary | ICD-10-CM | POA: Diagnosis not present

## 2023-03-06 DIAGNOSIS — D849 Immunodeficiency, unspecified: Secondary | ICD-10-CM | POA: Diagnosis not present

## 2023-03-06 DIAGNOSIS — Z940 Kidney transplant status: Secondary | ICD-10-CM | POA: Diagnosis not present

## 2023-03-06 DIAGNOSIS — B338 Other specified viral diseases: Secondary | ICD-10-CM | POA: Diagnosis not present

## 2023-03-06 DIAGNOSIS — J069 Acute upper respiratory infection, unspecified: Secondary | ICD-10-CM | POA: Diagnosis not present

## 2023-03-06 DIAGNOSIS — Z941 Heart transplant status: Secondary | ICD-10-CM | POA: Diagnosis not present

## 2023-03-06 DIAGNOSIS — M79672 Pain in left foot: Secondary | ICD-10-CM | POA: Diagnosis not present

## 2023-03-08 DIAGNOSIS — M25562 Pain in left knee: Secondary | ICD-10-CM | POA: Diagnosis not present

## 2023-03-08 DIAGNOSIS — D849 Immunodeficiency, unspecified: Secondary | ICD-10-CM | POA: Diagnosis not present

## 2023-03-08 DIAGNOSIS — I120 Hypertensive chronic kidney disease with stage 5 chronic kidney disease or end stage renal disease: Secondary | ICD-10-CM | POA: Diagnosis not present

## 2023-03-08 DIAGNOSIS — M79662 Pain in left lower leg: Secondary | ICD-10-CM | POA: Diagnosis not present

## 2023-03-08 DIAGNOSIS — N186 End stage renal disease: Secondary | ICD-10-CM | POA: Diagnosis not present

## 2023-03-08 DIAGNOSIS — M79605 Pain in left leg: Secondary | ICD-10-CM | POA: Diagnosis not present

## 2023-03-08 DIAGNOSIS — D72829 Elevated white blood cell count, unspecified: Secondary | ICD-10-CM | POA: Diagnosis not present

## 2023-03-08 DIAGNOSIS — I428 Other cardiomyopathies: Secondary | ICD-10-CM | POA: Diagnosis not present

## 2023-03-08 DIAGNOSIS — M25462 Effusion, left knee: Secondary | ICD-10-CM | POA: Diagnosis not present

## 2023-03-08 DIAGNOSIS — Z940 Kidney transplant status: Secondary | ICD-10-CM | POA: Diagnosis not present

## 2023-03-08 DIAGNOSIS — M25469 Effusion, unspecified knee: Secondary | ICD-10-CM | POA: Diagnosis not present

## 2023-03-08 DIAGNOSIS — Z5181 Encounter for therapeutic drug level monitoring: Secondary | ICD-10-CM | POA: Diagnosis not present

## 2023-03-08 DIAGNOSIS — M109 Gout, unspecified: Secondary | ICD-10-CM | POA: Diagnosis not present

## 2023-03-08 DIAGNOSIS — Z9889 Other specified postprocedural states: Secondary | ICD-10-CM | POA: Diagnosis not present

## 2023-03-08 DIAGNOSIS — Z941 Heart transplant status: Secondary | ICD-10-CM | POA: Diagnosis not present

## 2023-05-02 ENCOUNTER — Telehealth: Payer: Self-pay

## 2023-05-02 DIAGNOSIS — N183 Chronic kidney disease, stage 3 unspecified: Secondary | ICD-10-CM

## 2023-05-05 ENCOUNTER — Telehealth: Payer: Self-pay | Admitting: *Deleted

## 2023-05-05 NOTE — Progress Notes (Unsigned)
 Complex Care Management Note Care Guide Note  05/05/2023 Name: Alben Jepsen MRN: [id number] DOB: May 03, 1960   Complex Care Management Outreach Attempts: An unsuccessful telephone outreach was attempted today to offer the patient information about available complex care management services.  Follow Up Plan:  Additional outreach attempts will be made to offer the patient complex care management information and services.   Encounter Outcome:  No Answer Gwenevere Ghazi  [facility]  [facility], [facility] Guide  Direct Dial: [phone]  Fax [phone]

## 2023-05-06 NOTE — Progress Notes (Unsigned)
 Complex Care Management Note Care Guide Note  05/06/2023 Name: Labrandon Knoch MRN: [id number] DOB: 01-Sep-1960   Complex Care Management Outreach Attempts: A second unsuccessful outreach was attempted today to offer the patient with information about available complex care management services.  Follow Up Plan:  Additional outreach attempts will be made to offer the patient complex care management information and services.   Encounter Outcome:  No Answer  Gwenevere Ghazi  [facility]  [facility] Guide  Direct Dial: [phone]  Fax [phone]

## 2023-05-08 NOTE — Progress Notes (Signed)
 Complex Care Management Note Care Guide Note  05/08/2023 Name: Quincey Quesinberry MRN: [id number] DOB: 01-09-1961   Complex Care Management Outreach Attempts: A third unsuccessful outreach was attempted today to offer the patient with information about available complex care management services.  Follow Up Plan:  No further outreach attempts will be made at this time. We have been unable to contact the patient to offer or enroll patient in complex care management services.  Encounter Outcome:  No Answer  Gwenevere Ghazi  [facility]  [facility] Guide  Direct Dial: [phone]  Fax [phone]

## 2023-05-12 IMAGING — DX DG ABDOMEN 1V
1 series · 1 of 1 positions shown · non-contrast
Comparison: None.

CLINICAL DATA: Nasogastric tube placement.

EXAM:
ABDOMEN - 1 VIEW

[abdomen supine]
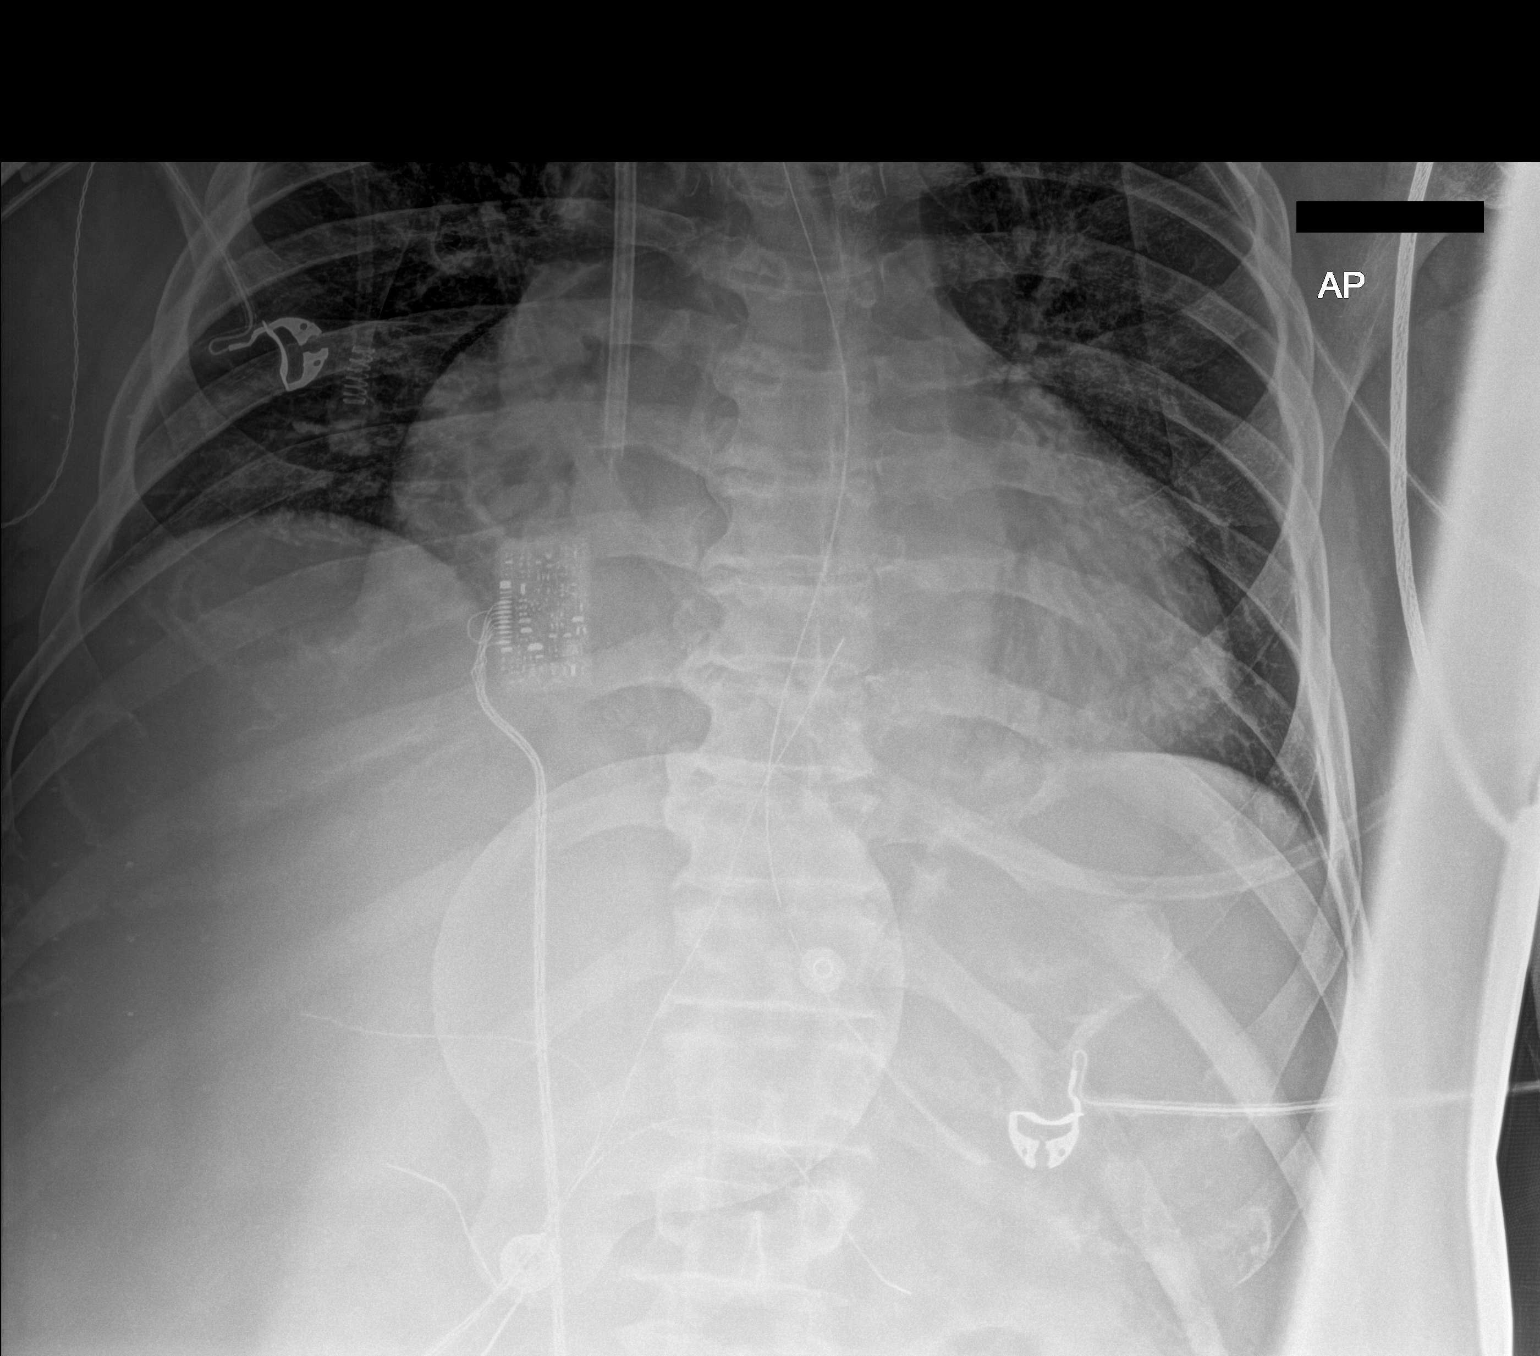

[1 of 1 positions shown; findings below may reference images not displayed]

FINDINGS: A nasogastric tube is seen with its distal tip overlying the body of
the stomach. Its distal side hole sits approximately 3.6 cm distal
to the expected region of the gastroesophageal junction. A paucity
of bowel gas is noted within the visualized portion of the upper
abdomen.
IMPRESSION: Nasogastric tube positioning, as described above.

## 2023-05-13 IMAGING — DX DG CHEST 1V PORT
1 series · 1 of 1 positions shown · non-contrast
Comparison: 11/22/2020

CLINICAL DATA: Interval change

EXAM:
PORTABLE CHEST 1 VIEW

[chest ap]
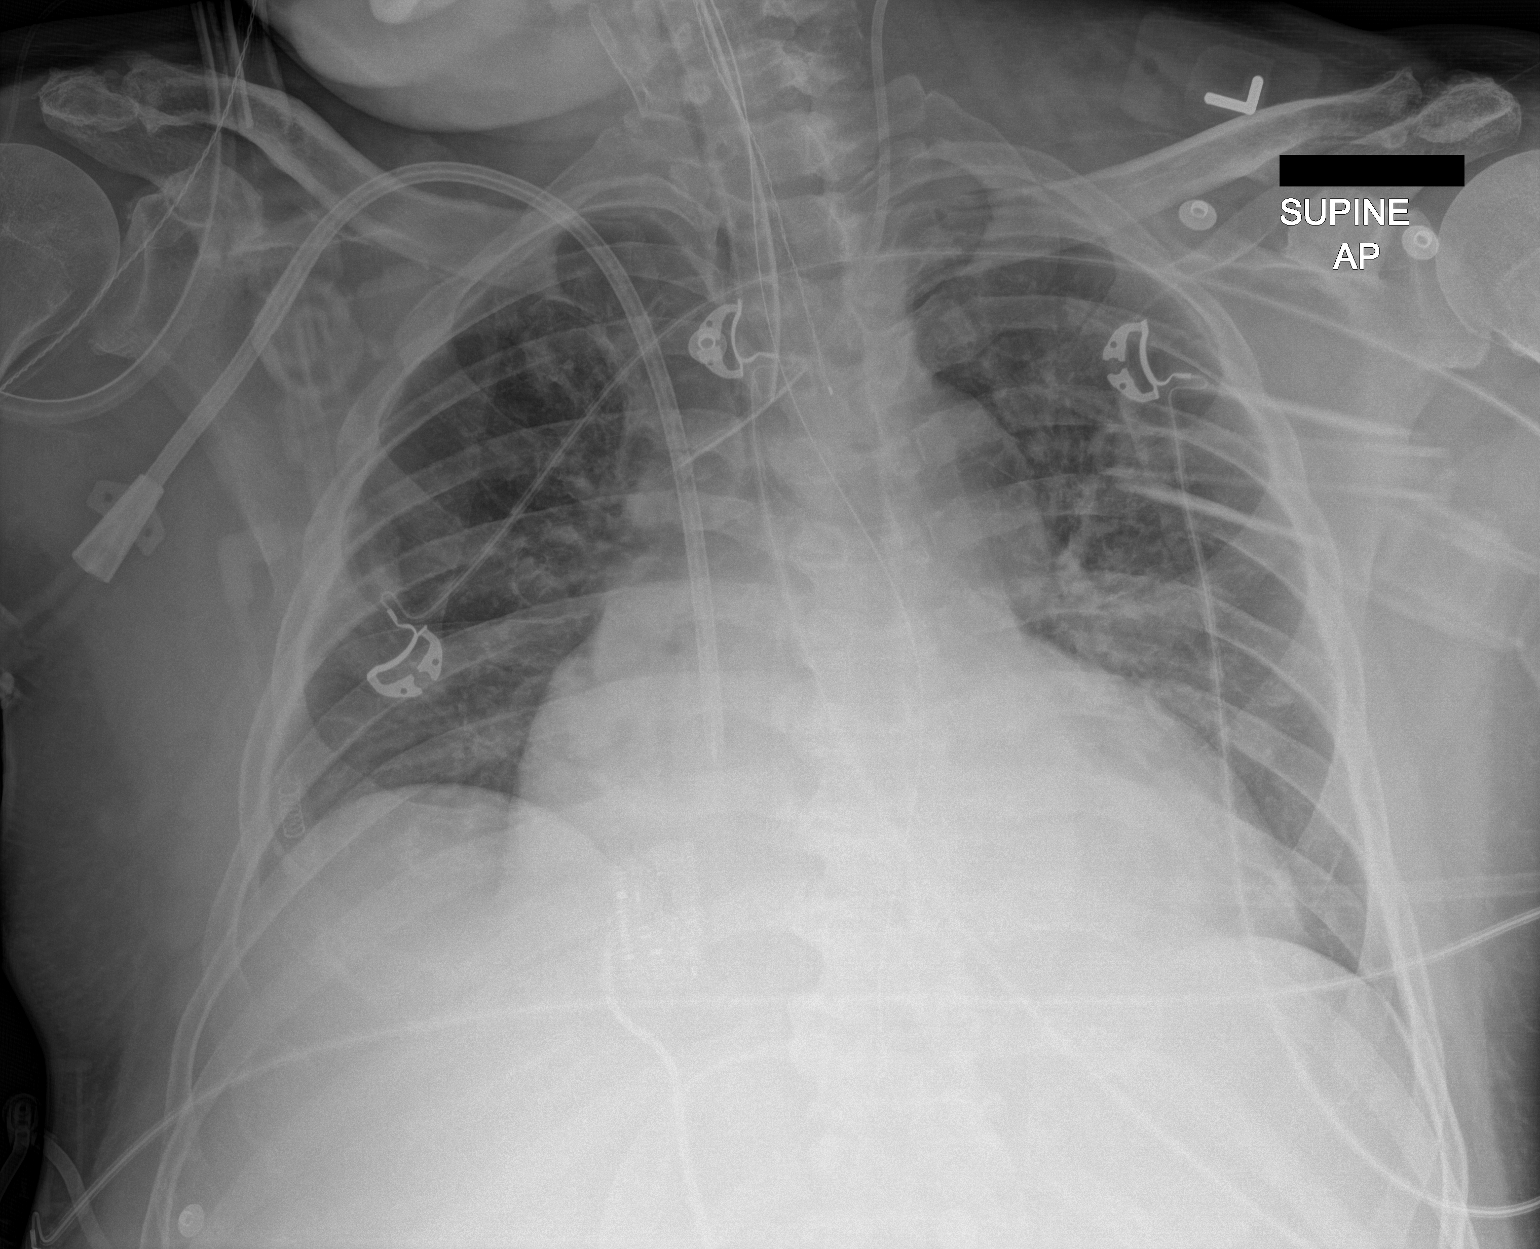

[1 of 1 positions shown; findings below may reference images not displayed]

FINDINGS: Endotracheal tube in good position. Central venous catheter tip in
the right atrium unchanged. NG tube in place. Central venous
catheter in the proximal SVC unchanged.

Cardiac enlargement. Bibasilar atelectasis without pulmonary edema
or effusion. No pneumothorax
IMPRESSION: Support lines remain in good position

Mild bibasilar atelectasis.  Negative for edema.
